# Patient Record
Sex: Male | Born: 1970 | Race: White | Hispanic: No | Marital: Married | Smoking: Never smoker
Health system: Southern US, Community
[De-identification: ages and names within clinical notes are randomized; demographics above are authoritative.]

## PROBLEM LIST (undated history)

## (undated) DIAGNOSIS — J45909 Unspecified asthma, uncomplicated: Secondary | ICD-10-CM

## (undated) DIAGNOSIS — N200 Calculus of kidney: Secondary | ICD-10-CM

## (undated) DIAGNOSIS — K219 Gastro-esophageal reflux disease without esophagitis: Secondary | ICD-10-CM

## (undated) DIAGNOSIS — I10 Essential (primary) hypertension: Secondary | ICD-10-CM

## (undated) DIAGNOSIS — D126 Benign neoplasm of colon, unspecified: Secondary | ICD-10-CM

## (undated) DIAGNOSIS — R7303 Prediabetes: Secondary | ICD-10-CM

## (undated) DIAGNOSIS — E669 Obesity, unspecified: Secondary | ICD-10-CM

## (undated) DIAGNOSIS — N189 Chronic kidney disease, unspecified: Secondary | ICD-10-CM

## (undated) DIAGNOSIS — G4730 Sleep apnea, unspecified: Secondary | ICD-10-CM

## (undated) DIAGNOSIS — J302 Other seasonal allergic rhinitis: Secondary | ICD-10-CM

## (undated) HISTORY — PX: ACHILLES TENDON REPAIR: SUR1153

## (undated) HISTORY — PX: TONSILLECTOMY: SUR1361

## (undated) HISTORY — PX: KIDNEY STONE SURGERY: SHX686

## (undated) HISTORY — PX: ESOPHAGOGASTRODUODENOSCOPY: SHX1529

## (undated) HISTORY — PX: OTHER SURGICAL HISTORY: SHX169

## (undated) HISTORY — PX: HERNIA REPAIR: SHX51

---

## 2004-07-16 ENCOUNTER — Emergency Department: Payer: Self-pay | Admitting: Emergency Medicine

## 2006-03-16 ENCOUNTER — Observation Stay: Payer: Self-pay | Admitting: Urology

## 2011-07-14 ENCOUNTER — Ambulatory Visit: Payer: Self-pay | Admitting: Family Medicine

## 2013-06-14 ENCOUNTER — Ambulatory Visit: Payer: Self-pay | Admitting: Gastroenterology

## 2013-08-26 ENCOUNTER — Ambulatory Visit: Payer: Self-pay | Admitting: Gastroenterology

## 2013-08-28 LAB — PATHOLOGY REPORT

## 2014-02-19 DIAGNOSIS — J45909 Unspecified asthma, uncomplicated: Secondary | ICD-10-CM | POA: Insufficient documentation

## 2014-05-11 IMAGING — US ABDOMEN ULTRASOUND LIMITED
1 series · 14 of 25 positions shown · non-contrast
Comparison: None.

CLINICAL DATA: Epigastric pain

EXAM:
US ABDOMEN LIMITED - RIGHT UPPER QUADRANT

[Series 1: abdomen ultrasound limited · 0.33mm/px · 14 of 48 slices shown]
[im 1/48]
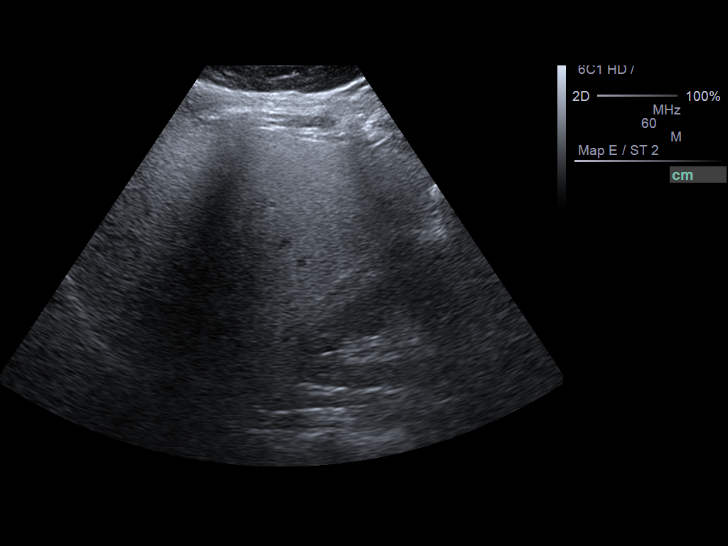
[im 4/48]
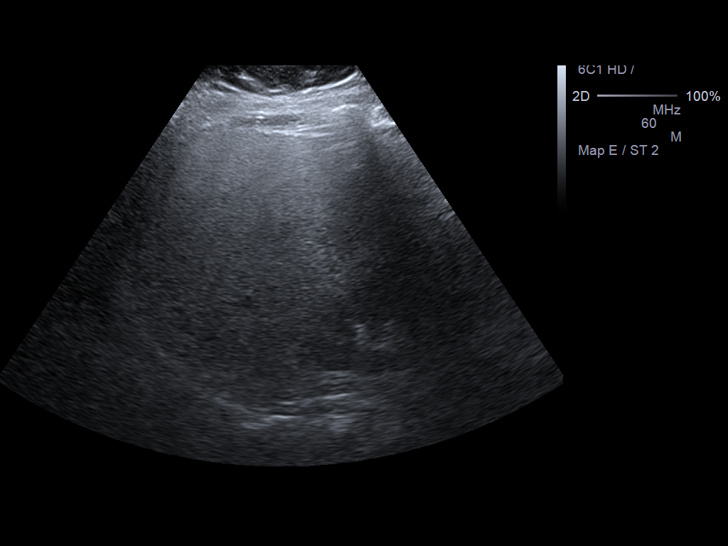
[im 8/48]
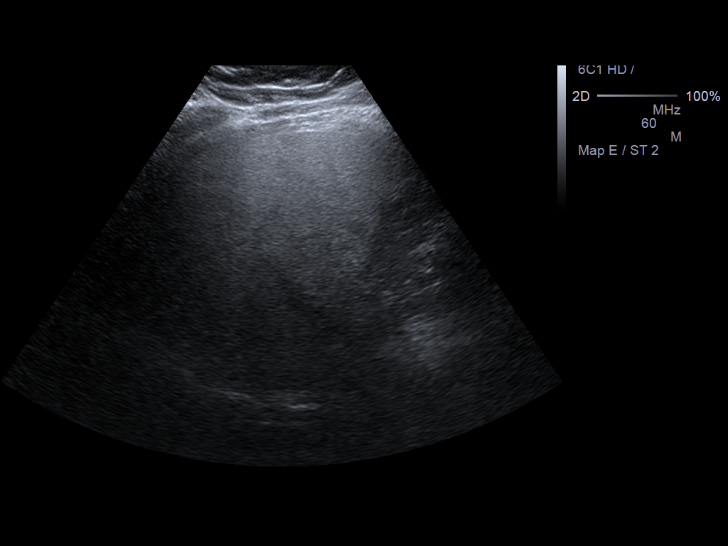
[im 12/48]
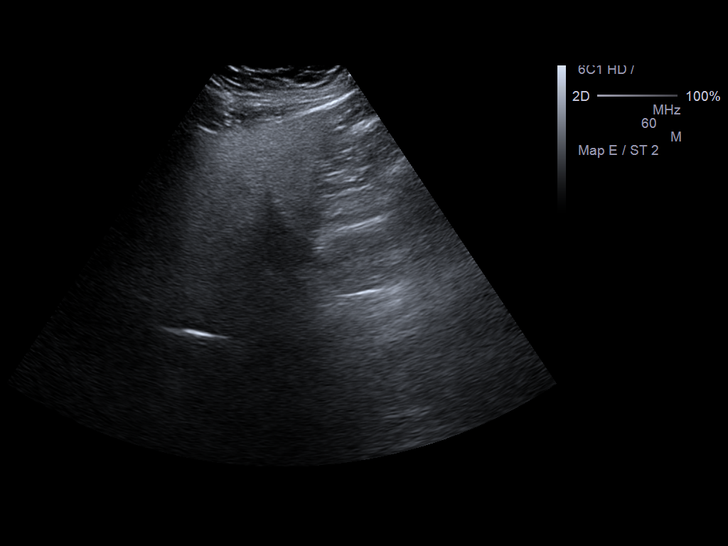
[im 16/48]
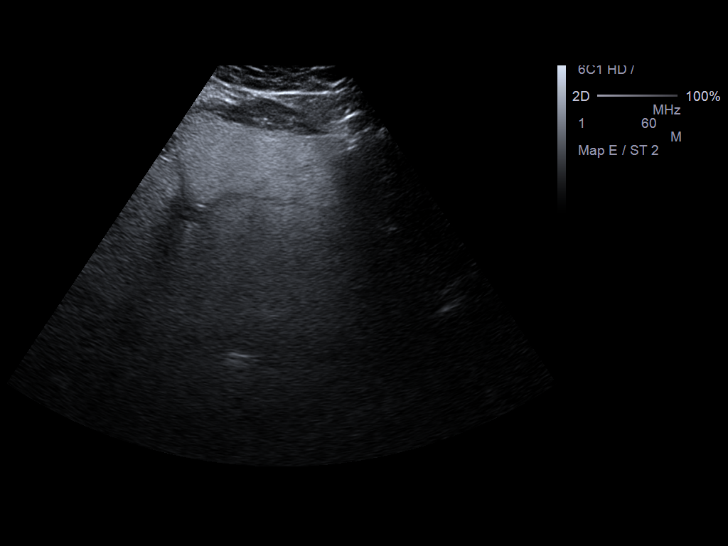
[im 18/48]
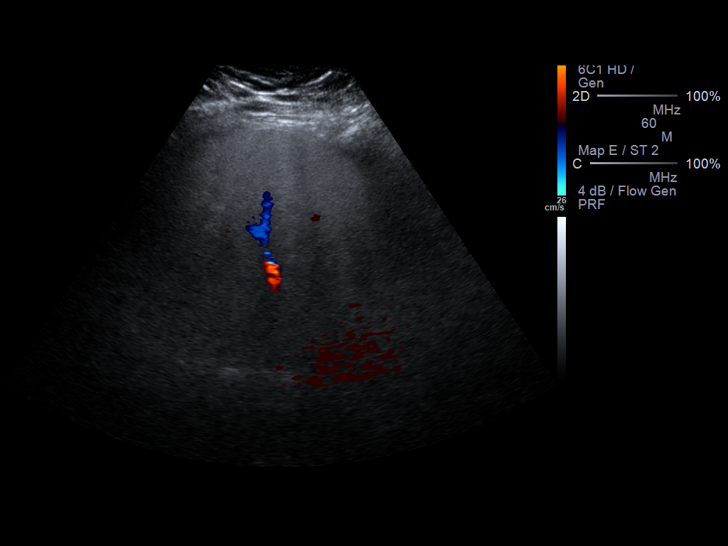
[im 22/48]
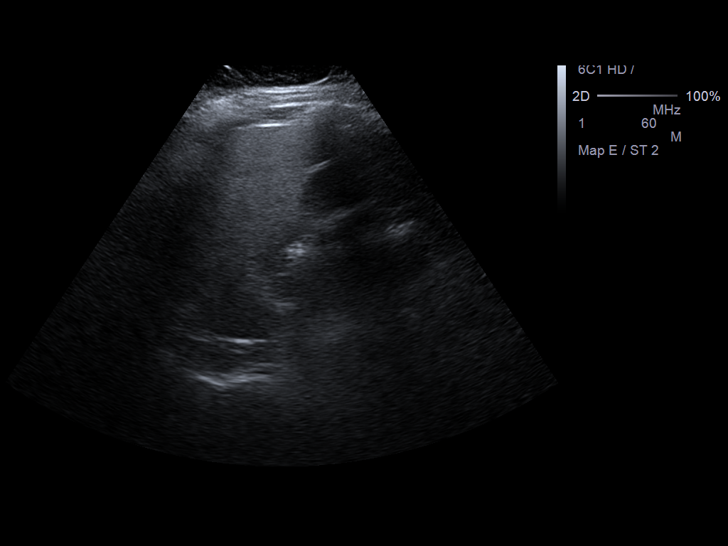
[im 26/48]
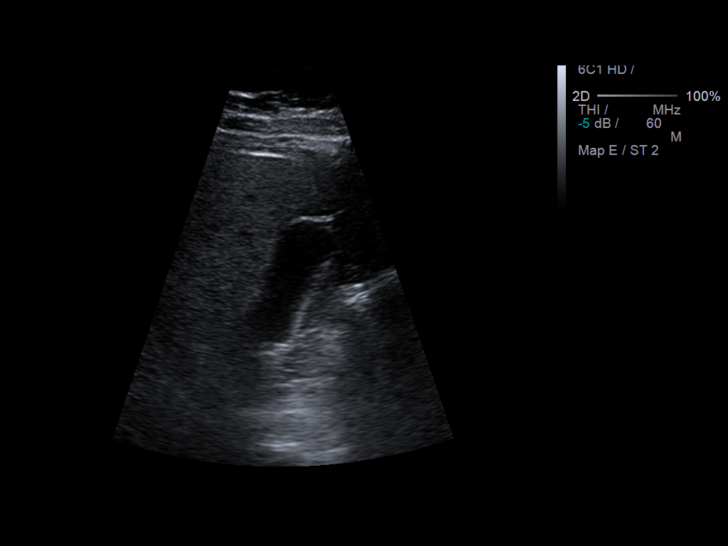
[im 30/48]
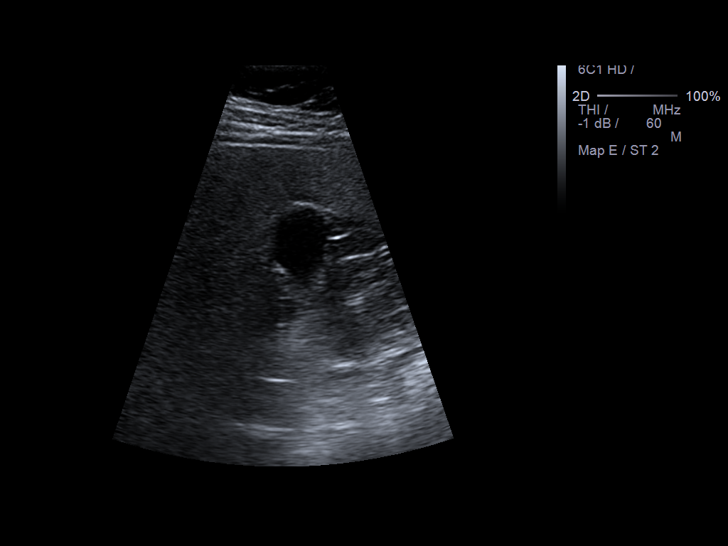
[im 32/48]
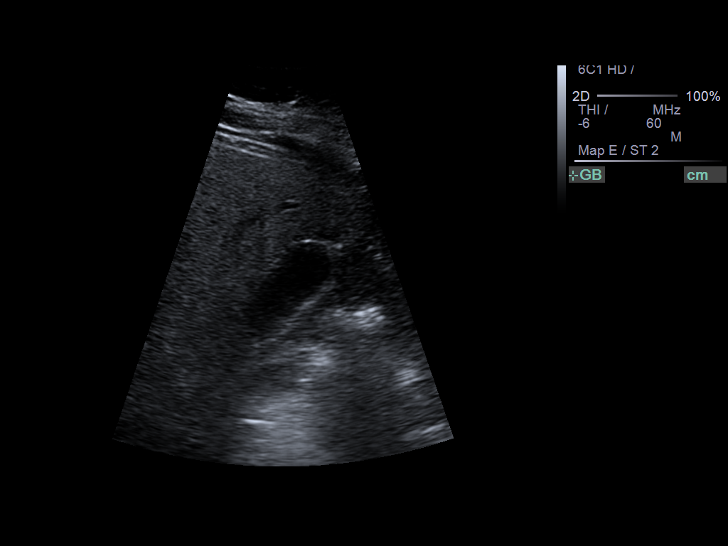
[im 36/48]
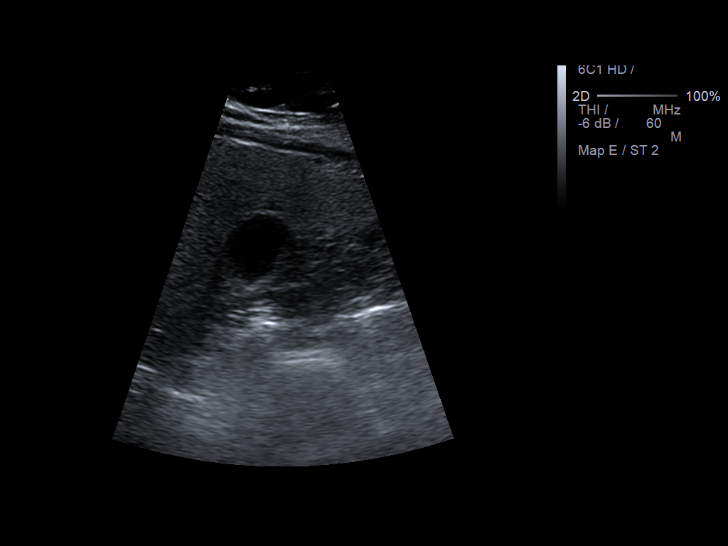
[im 40/48]
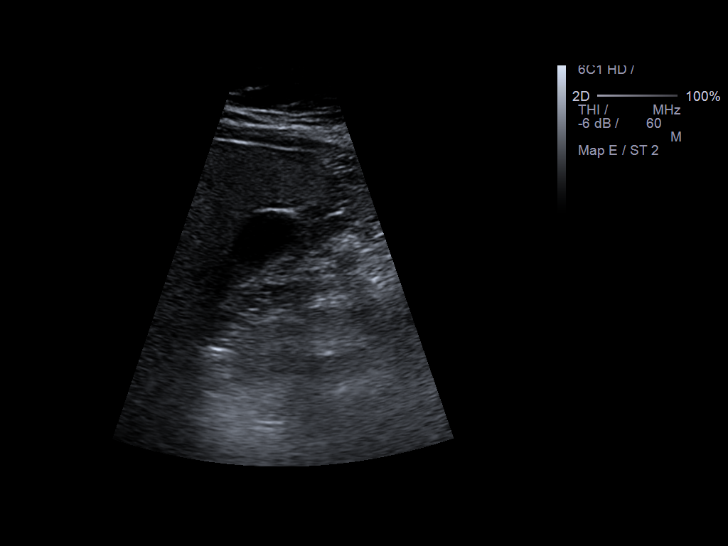
[im 44/48]
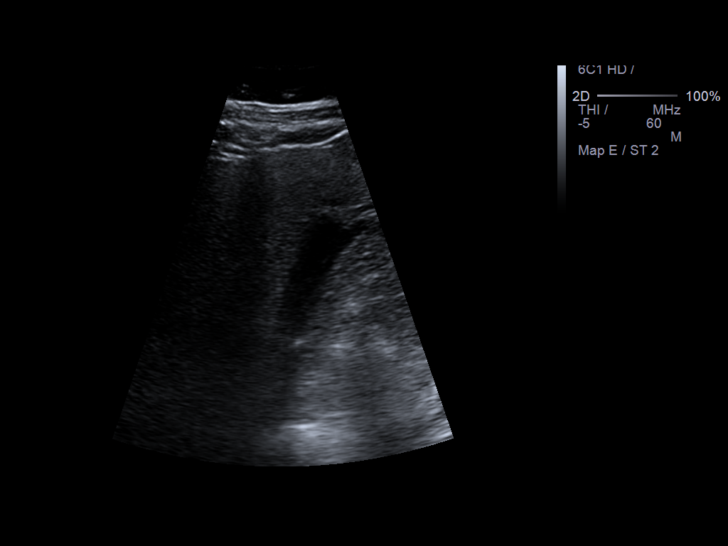
[im 48/48]
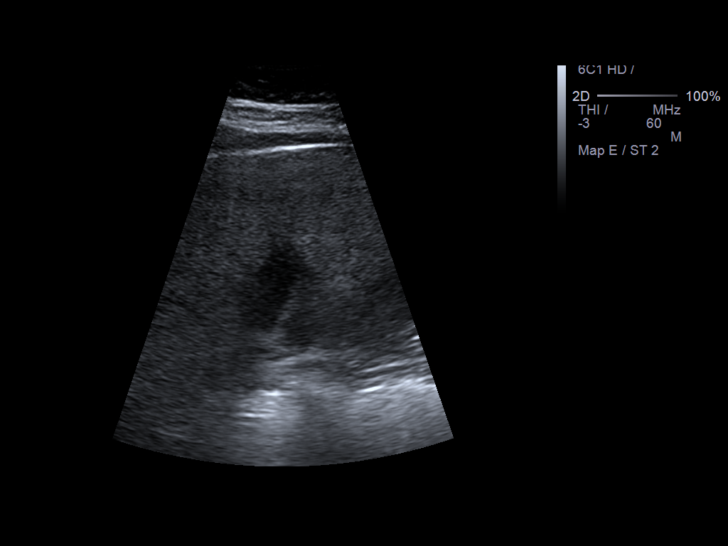

[14 of 25 positions shown; findings below may reference images not displayed]

FINDINGS: Gallbladder

No gallstones or wall thickening visualized. No sonographic Murphy
sign noted.

Common bile duct

Diameter: 3.8 mm

Liver:

Echogenic liver diffusely consistent with hepatocellular disease/
fatty infiltration. No focal liver lesion or ascites.
IMPRESSION: Negative for gallstone

Fatty liver.

## 2014-12-24 ENCOUNTER — Other Ambulatory Visit: Payer: Self-pay | Admitting: Internal Medicine

## 2014-12-24 DIAGNOSIS — E7800 Pure hypercholesterolemia, unspecified: Secondary | ICD-10-CM | POA: Insufficient documentation

## 2014-12-24 DIAGNOSIS — R739 Hyperglycemia, unspecified: Secondary | ICD-10-CM | POA: Insufficient documentation

## 2014-12-24 DIAGNOSIS — N289 Disorder of kidney and ureter, unspecified: Secondary | ICD-10-CM

## 2014-12-30 ENCOUNTER — Ambulatory Visit
Admission: RE | Admit: 2014-12-30 | Discharge: 2014-12-30 | Disposition: A | Discharge status: Home / Self Care | Payer: BLUE CROSS/BLUE SHIELD | Source: Ambulatory Visit | Attending: Internal Medicine | Admitting: Internal Medicine

## 2014-12-30 DIAGNOSIS — N289 Disorder of kidney and ureter, unspecified: Secondary | ICD-10-CM | POA: Diagnosis present

## 2015-08-09 IMAGING — US US RENAL
1 series · 14 of 25 positions shown · non-contrast
Comparison: Limited right upper quadrant ultrasound 06/14/2013

CLINICAL DATA: Acute renal insufficiency.

EXAM:
RENAL / URINARY TRACT ULTRASOUND COMPLETE

[Series 1: us renal · 0.24mm/px · 14 of 32 slices shown]
[im 1/32]
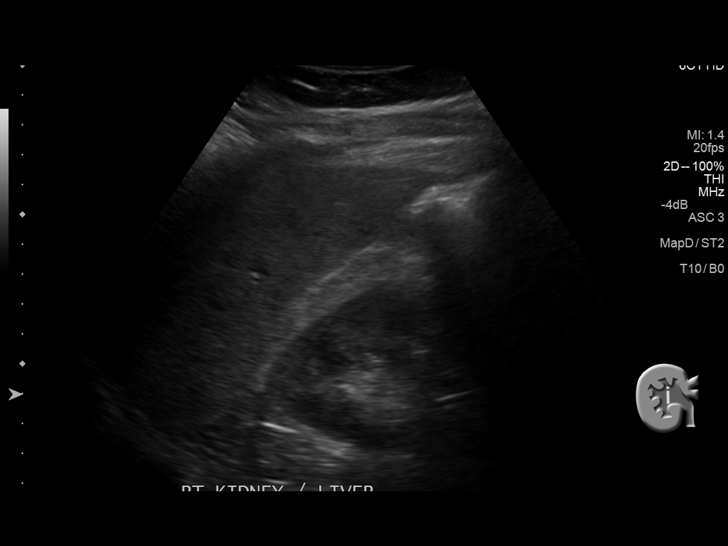
[im 3/32]
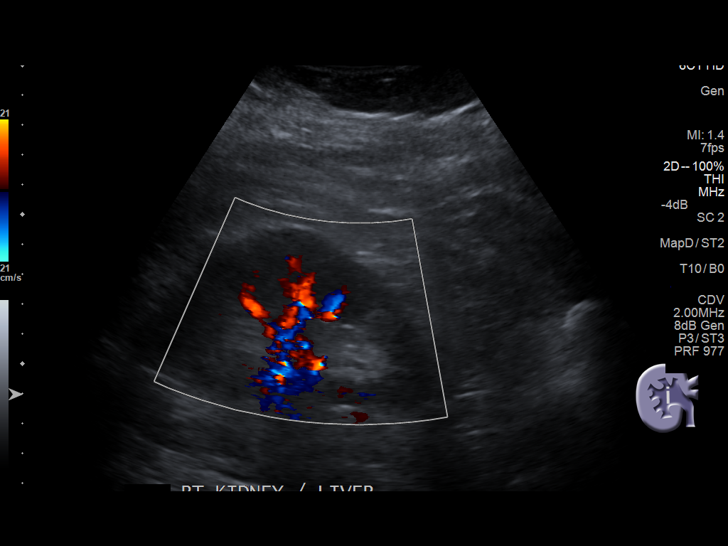
[im 6/32]
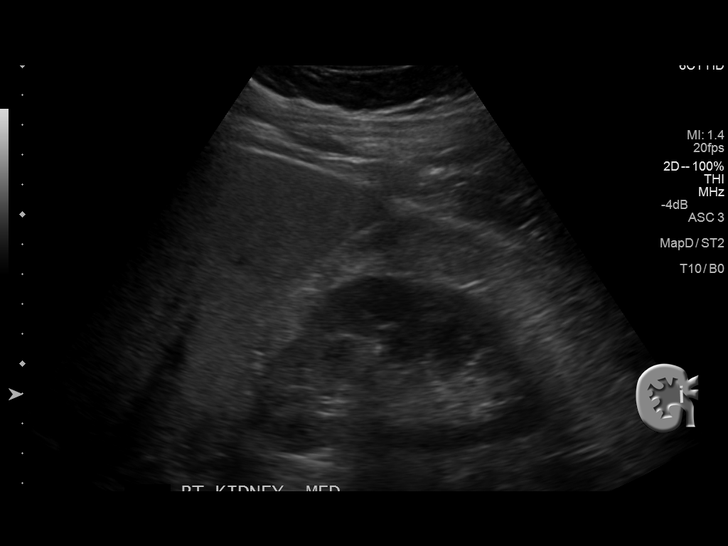
[im 8/32]
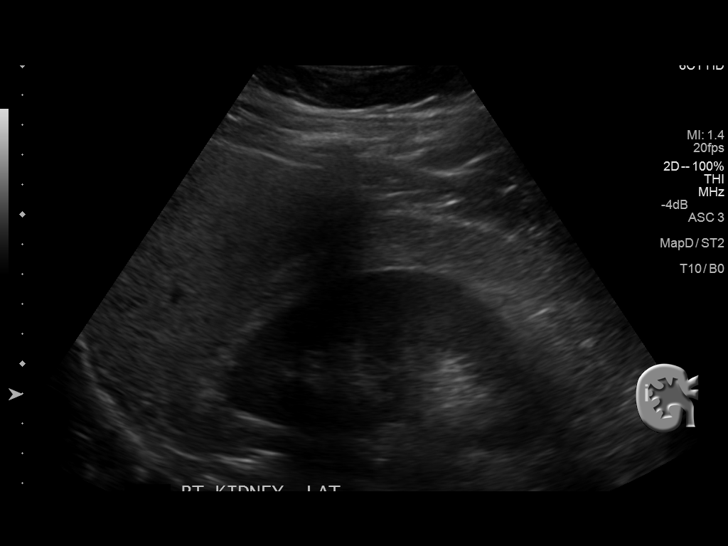
[im 11/32]
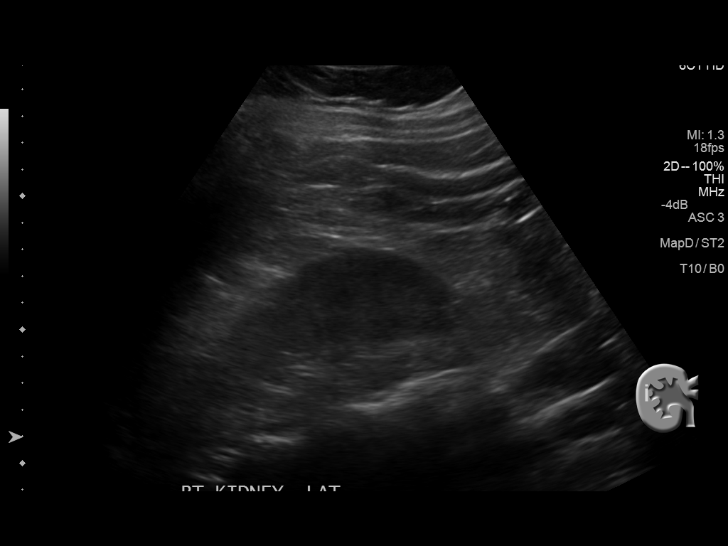
[im 12/32]
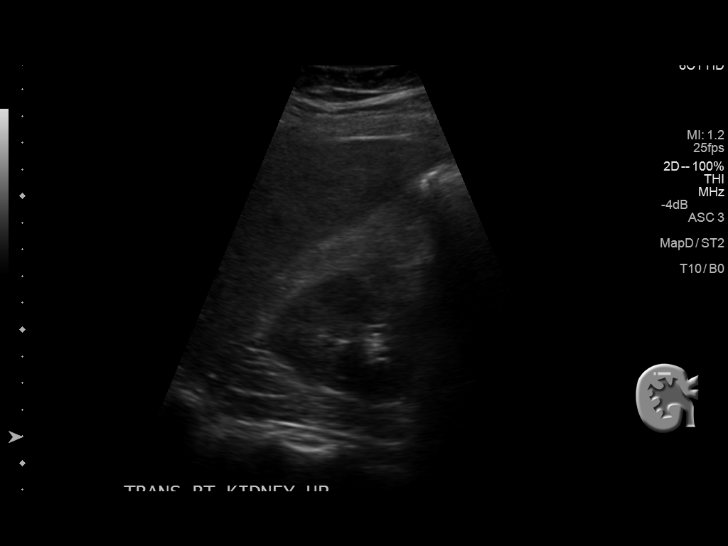
[im 15/32]
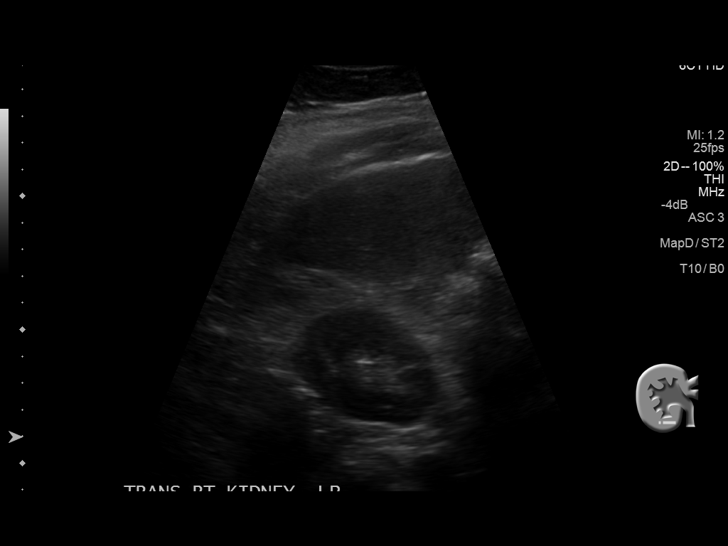
[im 17/32]
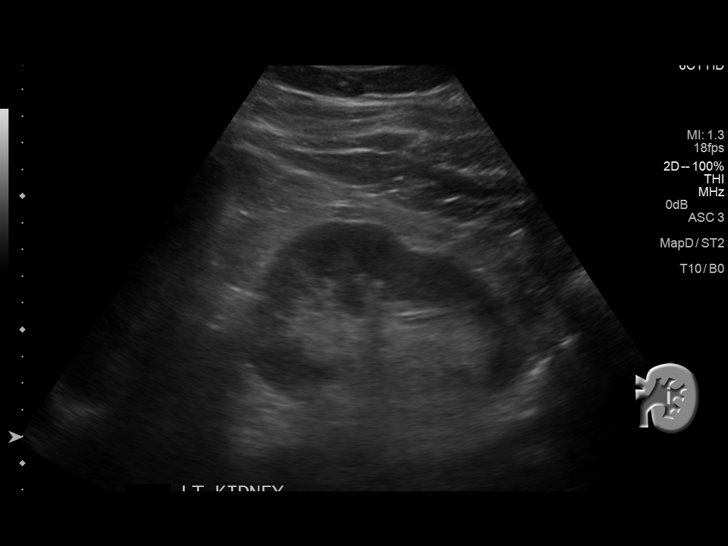
[im 20/32]
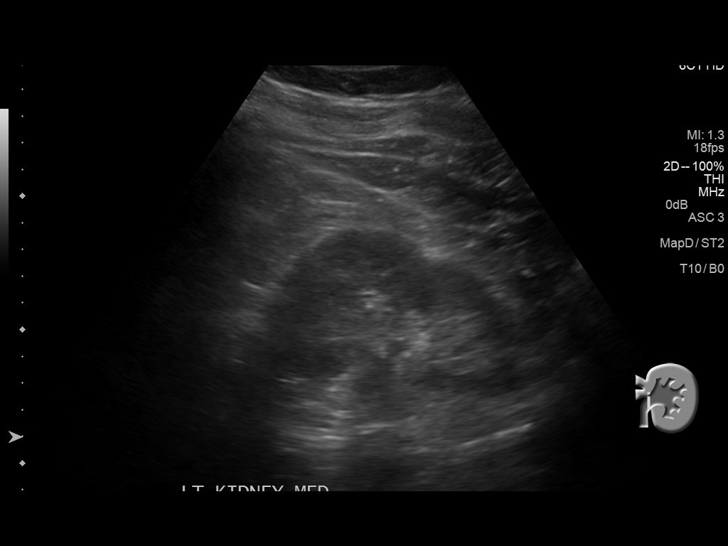
[im 21/32]
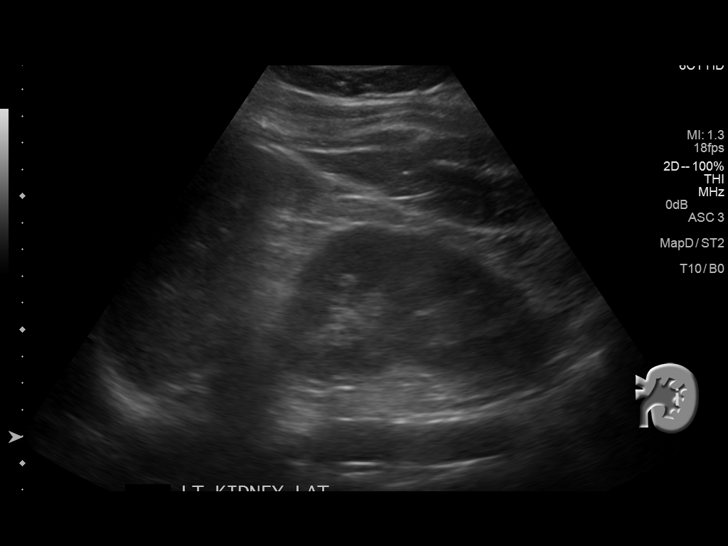
[im 24/32]
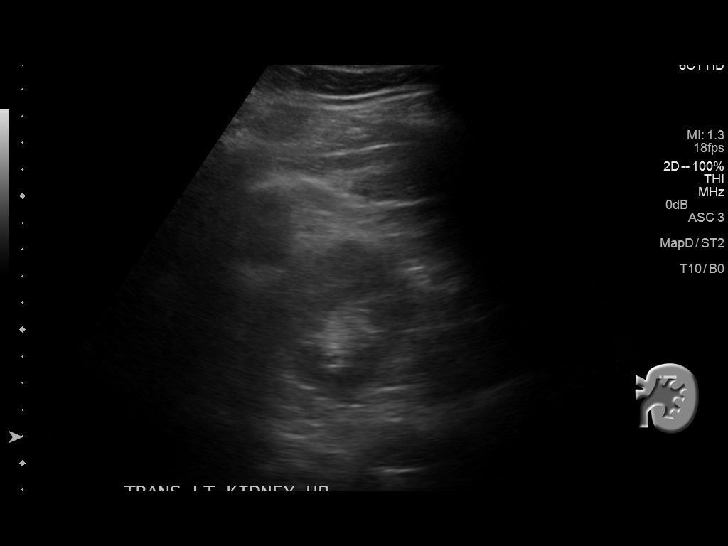
[im 26/32]
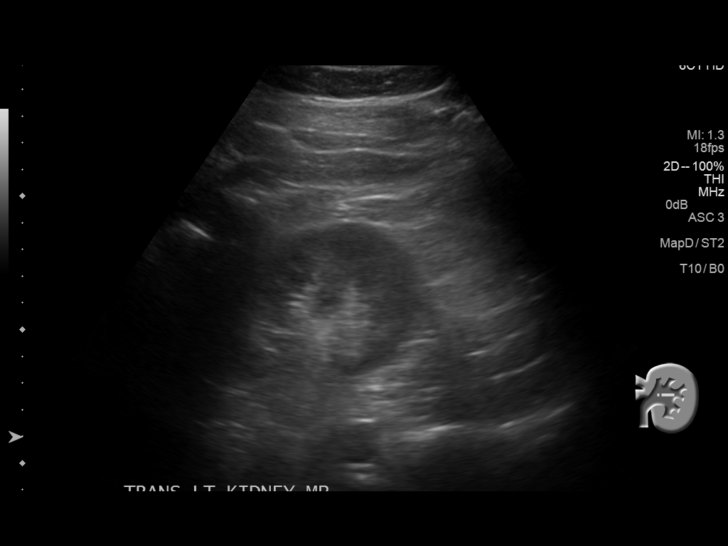
[im 29/32]
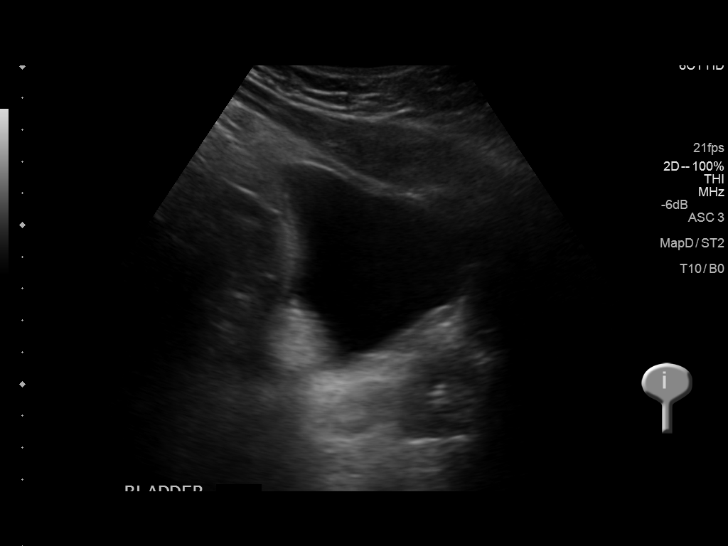
[im 32/32]
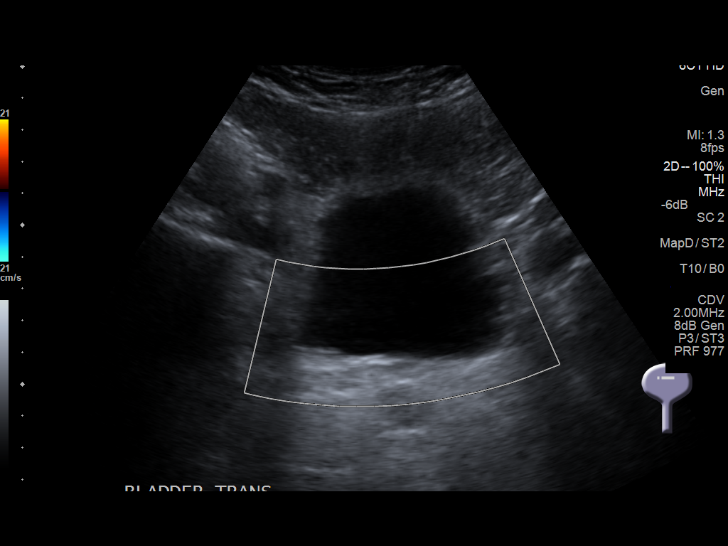

[14 of 25 positions shown; findings below may reference images not displayed]

FINDINGS: Right Kidney:

Length: 11.4 cm, within normal limits. Echogenicity within normal
limits. No mass or hydronephrosis visualized.

Left Kidney:

Length: 11.0 cm, within normal limits. Echogenicity within normal
limits. No mass or hydronephrosis visualized.

Bladder:

Appears normal for degree of bladder distention.
IMPRESSION: Negative renal ultrasound.

## 2015-12-29 ENCOUNTER — Encounter: Payer: Self-pay | Admitting: *Deleted

## 2015-12-29 ENCOUNTER — Inpatient Hospital Stay: Admission: RE | Admit: 2015-12-29 | Payer: BLUE CROSS/BLUE SHIELD | Source: Ambulatory Visit

## 2015-12-29 NOTE — Patient Instructions (Addendum)
  Your procedure is scheduled on: 01-04-16 (MONDAY) Report to [place] To find out your arrival time please call [phone] between 1PM - 3PM on 01-01-16 (FRIDAY)  Remember: Instructions that are not followed completely may result in serious medical risk, up to and including death, or upon the discretion of your surgeon and anesthesiologist your surgery may need to be rescheduled.    _X___ 1. Do not eat food or drink liquids after midnight. No gum chewing or hard candies.     _X___ 2. No Alcohol for 24 hours before or after surgery.   ____ 3. Bring all medications with you on the day of surgery if instructed.    _X___ 4. Notify your doctor if there is any change in your medical condition     (cold, fever, infections).     Do not wear jewelry, make-up, hairpins, clips or nail polish.  Do not wear lotions, powders, or perfumes. You may wear deodorant.  Do not shave 48 hours prior to surgery. Men may shave face and neck.  Do not bring valuables to the hospital.    [facility] is not responsible for any belongings or valuables.               Contacts, dentures or bridgework may not be worn into surgery.  Leave your suitcase in the car. After surgery it may be brought to your room.  For patients admitted to the hospital, discharge time is determined by your treatment team.   Patients discharged the day of surgery will not be allowed to drive home.   Please read over the following fact sheets that you were given:    _X___ Take these medicines the morning of surgery with A SIP OF WATER:    1. SINGULAIR  2. [place]  3. TAKE A NEXIUM ON Sunday NIGHT BEFORE BED  4.  5.  6.  ____ Fleet Enema (as directed)   ____ Use CHG Soap as directed  _X___ Use inhalers on the day of surgery-USE ALBUTEROL INHALER AT HOME AM [place]  ____ Stop metformin 2 days prior to surgery    ____ Take 1/2 of usual insulin dose the night before  surgery and none on the morning of surgery.   ____ Stop Coumadin/Plavix/aspirin-N/A  _X___ Stop Anti-inflammatories-NO NSAIDS OR ASA PRODUCTS-TYLENOL OK TO TAKE   ____ Stop supplements until after surgery.    ____ Bring C-Pap to the hospital.

## 2016-01-01 MED ORDER — PHENYLEPHRINE HCL 10 % OP SOLN
Freq: Once | OPHTHALMIC | Status: DC
  Filled 2016-01-01: qty 20

## 2016-01-04 ENCOUNTER — Ambulatory Visit
Admission: RE | Admit: 2016-01-04 | Discharge: 2016-01-04 | Disposition: A | Discharge status: Home / Self Care | Payer: BLUE CROSS/BLUE SHIELD | Source: Ambulatory Visit | Attending: Otolaryngology | Admitting: Otolaryngology

## 2016-01-04 ENCOUNTER — Ambulatory Visit: Payer: BLUE CROSS/BLUE SHIELD | Admitting: Anesthesiology

## 2016-01-04 ENCOUNTER — Encounter
Admission: RE | Disposition: A | Discharge status: Home / Self Care | Payer: Self-pay | Source: Ambulatory Visit | Attending: Otolaryngology

## 2016-01-04 ENCOUNTER — Encounter: Payer: Self-pay | Admitting: Anesthesiology

## 2016-01-04 DIAGNOSIS — G4733 Obstructive sleep apnea (adult) (pediatric): Secondary | ICD-10-CM | POA: Diagnosis not present

## 2016-01-04 DIAGNOSIS — J45909 Unspecified asthma, uncomplicated: Secondary | ICD-10-CM | POA: Diagnosis not present

## 2016-01-04 DIAGNOSIS — E669 Obesity, unspecified: Secondary | ICD-10-CM | POA: Diagnosis not present

## 2016-01-04 DIAGNOSIS — J3501 Chronic tonsillitis: Secondary | ICD-10-CM | POA: Diagnosis present

## 2016-01-04 DIAGNOSIS — J343 Hypertrophy of nasal turbinates: Secondary | ICD-10-CM | POA: Diagnosis present

## 2016-01-04 DIAGNOSIS — K219 Gastro-esophageal reflux disease without esophagitis: Secondary | ICD-10-CM | POA: Diagnosis not present

## 2016-01-04 DIAGNOSIS — Z6832 Body mass index (BMI) 32.0-32.9, adult: Secondary | ICD-10-CM | POA: Insufficient documentation

## 2016-01-04 DIAGNOSIS — J342 Deviated nasal septum: Secondary | ICD-10-CM | POA: Insufficient documentation

## 2016-01-04 HISTORY — DX: Sleep apnea, unspecified: G47.30

## 2016-01-04 HISTORY — PX: NASAL SEPTOPLASTY W/ TURBINOPLASTY: SHX2070

## 2016-01-04 HISTORY — PX: TONSILLECTOMY AND ADENOIDECTOMY: SHX28

## 2016-01-04 HISTORY — DX: Unspecified asthma, uncomplicated: J45.909

## 2016-01-04 HISTORY — DX: Chronic kidney disease, unspecified: N18.9

## 2016-01-04 HISTORY — DX: Gastro-esophageal reflux disease without esophagitis: K21.9

## 2016-01-04 HISTORY — DX: Other seasonal allergic rhinitis: J30.2

## 2016-01-04 SURGERY — SEPTOPLASTY, NOSE, WITH NASAL TURBINATE REDUCTION
Anesthesia: General

## 2016-01-04 MED ORDER — CEFAZOLIN SODIUM-DEXTROSE 2-4 GM/100ML-% IV SOLN
INTRAVENOUS | Status: AC
  Administered 2016-01-04: 2000 mg via INTRAVENOUS
  Filled 2016-01-04: qty 100

## 2016-01-04 MED ORDER — ONDANSETRON HCL 4 MG/2ML IJ SOLN
4.0000 mg | Freq: Once | INTRAMUSCULAR | Status: AC | PRN
  Administered 2016-01-04: 4 mg via INTRAVENOUS

## 2016-01-04 MED ORDER — FENTANYL CITRATE (PF) 100 MCG/2ML IJ SOLN
INTRAMUSCULAR | Status: AC
  Filled 2016-01-04: qty 2

## 2016-01-04 MED ORDER — ONDANSETRON HCL 4 MG/2ML IJ SOLN
INTRAMUSCULAR | Status: AC
  Filled 2016-01-04: qty 2

## 2016-01-04 MED ORDER — MIDAZOLAM HCL 2 MG/2ML IJ SOLN
INTRAMUSCULAR | Status: DC | PRN
  Administered 2016-01-04: 2 mg via INTRAVENOUS

## 2016-01-04 MED ORDER — BACITRACIN ZINC 500 UNIT/GM EX OINT
TOPICAL_OINTMENT | CUTANEOUS | Status: AC
  Filled 2016-01-04: qty 28.35

## 2016-01-04 MED ORDER — LIDOCAINE-EPINEPHRINE (PF) 1 %-1:200000 IJ SOLN
INTRAMUSCULAR | Status: DC | PRN
  Administered 2016-01-04: 5 mL

## 2016-01-04 MED ORDER — GLYCOPYRROLATE 0.2 MG/ML IJ SOLN
INTRAMUSCULAR | Status: AC
  Administered 2016-01-04: .2 mg via INTRAVENOUS
  Filled 2016-01-04: qty 1

## 2016-01-04 MED ORDER — FENTANYL CITRATE (PF) 100 MCG/2ML IJ SOLN
INTRAMUSCULAR | Status: DC | PRN
  Administered 2016-01-04: 50 ug via INTRAVENOUS
  Administered 2016-01-04: 100 ug via INTRAVENOUS
  Administered 2016-01-04 (×2): 50 ug via INTRAVENOUS

## 2016-01-04 MED ORDER — LIDOCAINE-EPINEPHRINE (PF) 1 %-1:200000 IJ SOLN
INTRAMUSCULAR | Status: AC
  Filled 2016-01-04: qty 30

## 2016-01-04 MED ORDER — OXYMETAZOLINE HCL 0.05 % NA SOLN
NASAL | Status: AC
  Administered 2016-01-04: 2 via NASAL
  Filled 2016-01-04: qty 15

## 2016-01-04 MED ORDER — SUGAMMADEX SODIUM 200 MG/2ML IV SOLN
INTRAVENOUS | Status: DC | PRN
  Administered 2016-01-04: 200 mg via INTRAVENOUS

## 2016-01-04 MED ORDER — CEFAZOLIN SODIUM-DEXTROSE 2-4 GM/100ML-% IV SOLN
2.0000 g | Freq: Once | INTRAVENOUS | Status: AC
  Administered 2016-01-04: 2000 mg via INTRAVENOUS

## 2016-01-04 MED ORDER — ROCURONIUM BROMIDE 100 MG/10ML IV SOLN
INTRAVENOUS | Status: DC | PRN
  Administered 2016-01-04 (×3): 5 mg via INTRAVENOUS
  Administered 2016-01-04: 30 mg via INTRAVENOUS

## 2016-01-04 MED ORDER — FENTANYL CITRATE (PF) 100 MCG/2ML IJ SOLN
25.0000 ug | INTRAMUSCULAR | Status: DC | PRN
  Administered 2016-01-04 (×2): 25 ug via INTRAVENOUS

## 2016-01-04 MED ORDER — OXYMETAZOLINE HCL 0.05 % NA SOLN
2.0000 | Freq: Once | NASAL | Status: AC
  Administered 2016-01-04: 2 via NASAL

## 2016-01-04 MED ORDER — LIDOCAINE HCL (CARDIAC) 20 MG/ML IV SOLN
INTRAVENOUS | Status: DC | PRN
  Administered 2016-01-04: 40 mg via INTRAVENOUS

## 2016-01-04 MED ORDER — OXYCODONE HCL 5 MG/5ML PO SOLN
ORAL | Status: AC
  Administered 2016-01-04: 5 mg
  Filled 2016-01-04: qty 5

## 2016-01-04 MED ORDER — DEXAMETHASONE SODIUM PHOSPHATE 10 MG/ML IJ SOLN
INTRAMUSCULAR | Status: DC | PRN
  Administered 2016-01-04: 10 mg via INTRAVENOUS

## 2016-01-04 MED ORDER — PROPOFOL 10 MG/ML IV BOLUS
INTRAVENOUS | Status: DC | PRN
  Administered 2016-01-04: 170 mg via INTRAVENOUS

## 2016-01-04 MED ORDER — LACTATED RINGERS IV SOLN
INTRAVENOUS | Status: DC
  Administered 2016-01-04 (×2): via INTRAVENOUS

## 2016-01-04 MED ORDER — PHENYLEPHRINE HCL 10 % OP SOLN
OPHTHALMIC | Status: DC | PRN
  Administered 2016-01-04: 08:00:00 via TOPICAL

## 2016-01-04 MED ORDER — SUCCINYLCHOLINE CHLORIDE 20 MG/ML IJ SOLN
INTRAMUSCULAR | Status: DC | PRN
  Administered 2016-01-04: 80 mg via INTRAVENOUS

## 2016-01-04 MED ORDER — ONDANSETRON HCL 4 MG/2ML IJ SOLN
INTRAMUSCULAR | Status: DC | PRN
  Administered 2016-01-04: 4 mg via INTRAVENOUS

## 2016-01-04 SURGICAL SUPPLY — 36 items
BLADE SURG 15 STRL LF DISP TIS (BLADE) ×2 IMPLANT
BLADE SURG 15 STRL SS (BLADE) ×2
CANISTER SUCT 1200ML W/VALVE (MISCELLANEOUS) ×8 IMPLANT
CATH ROBINSON RED A/P 10FR (CATHETERS) ×4 IMPLANT
COAG SUCT 10F 3.5MM HAND CTRL (MISCELLANEOUS) ×4 IMPLANT
DECANTER SPIKE VIAL GLASS SM (MISCELLANEOUS) ×4 IMPLANT
DRSG NASAL 4CM NASOPORE (MISCELLANEOUS) ×4 IMPLANT
ELECT CAUTERY BLADE TIP 2.5 (TIP) ×4
ELECT REM PT RETURN 9FT ADLT (ELECTROSURGICAL) ×4
ELECTRODE CAUTERY BLDE TIP 2.5 (TIP) ×2 IMPLANT
ELECTRODE REM PT RTRN 9FT ADLT (ELECTROSURGICAL) ×2 IMPLANT
GLOVE EXAM LX STRL 7.5 (GLOVE) ×4 IMPLANT
GOWN STRL REUS W/ TWL LRG LVL3 (GOWN DISPOSABLE) ×4 IMPLANT
GOWN STRL REUS W/TWL LRG LVL3 (GOWN DISPOSABLE) ×4
HANDLE YANKAUER SUCT BULB TIP (MISCELLANEOUS) IMPLANT
LABEL OR SOLS (LABEL) ×4 IMPLANT
NEEDLE SPNL 25GX3.5 QUINCKE BL (NEEDLE) IMPLANT
NS IRRIG 500ML POUR BTL (IV SOLUTION) ×4 IMPLANT
PACK BASIC III (MISCELLANEOUS)
PACK HEAD/NECK (MISCELLANEOUS) ×4 IMPLANT
PACK SRG BSC III STRL LF (MISCELLANEOUS) IMPLANT
PATTIES SURGICAL .5 X3 (DISPOSABLE) ×4 IMPLANT
PENCIL ELECTRO HAND CTR (MISCELLANEOUS) IMPLANT
SPLINT NASAL REUTER (MISCELLANEOUS) ×4 IMPLANT
SPONGE TONSIL 1 RF SGL (DISPOSABLE) ×4 IMPLANT
SPONGE XRAY 4X4 16PLY STRL (MISCELLANEOUS) IMPLANT
SUT CHROMIC 3-0 (SUTURE) ×2
SUT CHROMIC 3-0 KS 27XMFL CR (SUTURE) ×2
SUT ETHILON 3-0 KS 30 BLK (SUTURE) ×4 IMPLANT
SUT PLAIN GUT 4-0 (SUTURE) ×4 IMPLANT
SUT VIC AB 3-0 SH 27 (SUTURE) ×4
SUT VIC AB 3-0 SH 27X BRD (SUTURE) ×4 IMPLANT
SUTURE CHRMC 3-0 KS 27XMFL CR (SUTURE) ×2 IMPLANT
SYR 3ML LL SCALE MARK (SYRINGE) ×4 IMPLANT
TUBING CONNECTING 10 (TUBING) IMPLANT
TUBING CONNECTING 10' (TUBING)

## 2016-01-04 NOTE — Op Note (Signed)
01/04/2016  8:48 AM   [id number]   Pre-Op Dx:  Deviated Nasal Septum, Hypertrophic Inferior Turbinates, chronic tonsillitis, obstructive sleep apnea  Post-op Dx: Same  Proc: Nasal Septoplasty, Bilateral Partial Reduction Inferior Turbinates, tonsillectomy  Surg:  Corazon Nickolas H  Anes:  GOT  EBL:  50 mL  Comp:  None  Findings: Septum markedly deviated to left side with a very large right inferior turbinate. Very cryptic tonsils with lots of debris hiding in them. Very long uvula  Procedure: With the patient in a comfortable supine position,  general orotracheal anesthesia was induced without difficulty.     The patient received preoperative Afrin spray for topical decongestion and vasoconstriction.  Intravenous prophylactic antibiotics were administered.  At an appropriate level, the patient was placed in a semi-sitting position.  Nasal vibrissae were trimmed.   1% Xylocaine with 1:100,000 epinephrine, 10 cc's, was infiltrated into the anterior floor of the nose, into the nasal spine region, into the membranous columella, and finally into the submucoperichondrial plane of the septum on both sides.  Several minutes were allowed for this to take effect.  Cottoniod pledgetts soaked in Afrin and 4% Xylocaine were placed into both nasal cavities and left while the patient was prepped and draped in the standard fashion.  The materials were removed from the nose and observed to be intact and correct in number.  The nose was inspected with a headlight with the findings as described above.  A left Killian incision was sharply executed and carried down to the caudal edge of the quadrangular cartilage. The mucoperichondrium was elelvated along the quadrangular plate back to the bony-cartilaginous junction. The mucoperiostium was then elevated along the ethmoid plate and the vomer. The boney-catilaginous junction was then split with a freer elevator and the mucoperiosteum was elevated on the opposite  side. The mucoperiosteum was then elevated along the maxillary crest as needed to expose the crooked bone of the crest.  Boney spurs of the vomer and maxillary crest were removed with [place]avan Foil forceps.  The cartilaginous plate was trimmed along its posterior and inferior borders of about 2 mm of cartilage to free it up inferiorly. Some of the deviated ethmoid plate was then fractured and removed with Takahashi forceps to free up the posterior border of the quadrangular plate and allow it to swing back to the midline. The mucosal flaps were placed back into their anatomic position to allow visualization of the airways. The septum now sat in the midline with an improved airway.  The mucosal flaps are then sutured together using a through and through whip stitch of 4-0 [place] with a mini-Keith needle. This was used to close the [place] incision as well.   The inferior turbinates were then inspected. An incision was created along the inferior aspect of the left inferior turbinate with removal of some of the inferior soft tissue and bone. Electrocautery was used to control bleeding in the area. The remaining turbinate was then outfractured to open up the airway further. There was no significant bleeding noted. The right turbinate was then trimmed and outfractured in a similar fashion. The right inferior turbinate was larger and more work had to be done on the right side.  The airways were then visualized and showed open passageways on both sides that were significantly improved compared to before surgery. There was no signifcant bleeding. Nasal splints were applied to both sides of the septum using Xomed 0.15mm regular sized splints that were trimmed, and then held in position with  a 3-0 Nylon through and through suture.  A Davis mouth gag was used to then visualize the oropharynx. The tonsils were very cryptic and had lots of food debris hiding in both tonsils. The tonsils were grasped and pulled medially  and the anterior pillar was incised with electrocautery. The tonsils were dissected from their fossa using blunt dissection and electrocautery. The peritonsillar area was quite scarred down to the underlying muscles. Bleeding was controlled with electrocautery. This was done bilaterally. The posterior tonsillar pillar was then sutured to the anterior tonsillar pillar anteriorly to help close the superior half of the tonsillar fossa. This helped to widen the posterior airway. The uvula was extremely long and was trimmed about one half its length to help stop some of the snoring. There is no further bleeding noted in the oropharynx. The naso pharynx was visualized and there was no sign of any significant adenoid tissue.  The patient was turned back over to anesthesia, and awakened, extubated, and taken to the PACU in satisfactory condition.  Dispo:   PACU to home  Plan: Ice, elevation, narcotic analgesia, steroid taper, and prophylactic antibiotics for the duration of indwelling nasal foreign bodies.  We will reevaluate the patient in the office in 6 days and remove the septal splints.  Return to work in 10 days, strenuous activities in two weeks.   Jesten Cappuccio H 01/04/2016 8:48 AM

## 2016-01-04 NOTE — Transfer of Care (Signed)
Immediate Anesthesia Transfer of Care Note  Patient: Christopher Campos  Procedure(s) Performed: Procedure(s): NASAL SEPTOPLASTY WITH SUBMUCOUS RESECTION OF INFERIOR TURBINATES (Bilateral) TONSILLECTOMY AND ADENOIDECTOMY (N/A)  Patient Location: PACU  Anesthesia Type:General  Level of Consciousness: sedated  Airway & Oxygen Therapy: Patient Spontanous Breathing and Patient connected to face mask oxygen  Post-op Assessment: Report given to RN and Post -op Vital signs reviewed and stable  Post vital signs: Reviewed and stable  Last Vitals:  Filed Vitals:   01/04/16 0639 01/04/16 0855  BP: 122/72 122/85  Pulse: 72 43  Temp: 37.1 C 36.3 C  Resp: 16 7    Last Pain: There were no vitals filed for this visit.       Complications: No apparent anesthesia complications

## 2016-01-04 NOTE — Anesthesia Procedure Notes (Signed)
Procedure Name: Intubation Date/Time: 01/04/2016 7:40 AM Performed by: Allean Found Pre-anesthesia Checklist: Patient identified, Emergency Drugs available, Suction available, Patient being monitored and Timeout performed Patient Re-evaluated:Patient Re-evaluated prior to inductionOxygen Delivery Method: Circle system utilized Preoxygenation: Pre-oxygenation with 100% oxygen Intubation Type: IV induction Ventilation: Mask ventilation without difficulty Laryngoscope Size: Mac and 3 Grade View: Grade II Tube type: Oral Rae Tube size: 7.5 mm Number of attempts: 2 Airway Equipment and Method: Stylet Placement Confirmation: ETT inserted through vocal cords under direct vision,  positive ETCO2 and breath sounds checked- equal and bilateral Secured at: 22 cm Tube secured with: Tape Dental Injury: Teeth and Oropharynx as per pre-operative assessment  Difficulty Due To: Difficulty was anticipated and Difficult Airway- due to anterior larynx Future Recommendations: Recommend- induction with short-acting agent, and alternative techniques readily available

## 2016-01-04 NOTE — Anesthesia Preprocedure Evaluation (Signed)
Anesthesia Evaluation  Patient identified by MRN, date of birth, ID band Patient awake    Reviewed: Allergy & Precautions, NPO status , Patient's Chart, lab work & pertinent test results, reviewed documented beta blocker date and time   Airway Mallampati: III  TM Distance: >3 FB     Dental  (+) Chipped   Pulmonary asthma , sleep apnea ,           Cardiovascular      Neuro/Psych    GI/Hepatic GERD  Controlled,  Endo/Other    Renal/GU Renal InsufficiencyRenal disease     Musculoskeletal   Abdominal   Peds  Hematology   Anesthesia Other Findings Does not use CPAP. Obese.   Reproductive/Obstetrics                             Anesthesia Physical Anesthesia Plan  ASA: III  Anesthesia Plan: General   Post-op Pain Management:    Induction: Intravenous  Airway Management Planned: Oral ETT  Additional Equipment:   Intra-op Plan:   Post-operative Plan:   Informed Consent: I have reviewed the patients History and Physical, chart, labs and discussed the procedure including the risks, benefits and alternatives for the proposed anesthesia with the patient or authorized representative who has indicated his/her understanding and acceptance.     Plan Discussed with: CRNA  Anesthesia Plan Comments:         Anesthesia Quick Evaluation

## 2016-01-04 NOTE — Anesthesia Postprocedure Evaluation (Signed)
Anesthesia Post Note  Patient: Christopher Campos  Procedure(s) Performed: Procedure(s) (LRB): NASAL SEPTOPLASTY WITH SUBMUCOUS RESECTION OF INFERIOR TURBINATES (Bilateral) TONSILLECTOMY  (N/A)  Patient location during evaluation: PACU Anesthesia Type: General Level of consciousness: awake and alert Pain management: pain level controlled Vital Signs Assessment: post-procedure vital signs reviewed and stable Respiratory status: spontaneous breathing, nonlabored ventilation, respiratory function stable and patient connected to nasal cannula oxygen Cardiovascular status: blood pressure returned to baseline and stable Postop Assessment: no signs of nausea or vomiting Anesthetic complications: no    Last Vitals:  Filed Vitals:   01/04/16 1020 01/04/16 1210  BP: 142/100 130/84  Pulse: 70 67  Temp: 36.4 C 36.7 C  Resp: 14 16    Last Pain:  Filed Vitals:   01/04/16 1237  PainSc: [place]

## 2016-01-04 NOTE — Discharge Instructions (Signed)

## 2016-01-04 NOTE — H&P (Signed)
  H&P has been reviewed and no changes necessary. To be downloaded later. 

## 2016-01-05 LAB — SURGICAL PATHOLOGY

## 2016-03-21 ENCOUNTER — Ambulatory Visit (INDEPENDENT_AMBULATORY_CARE_PROVIDER_SITE_OTHER): Payer: BLUE CROSS/BLUE SHIELD | Admitting: Urology

## 2016-03-21 ENCOUNTER — Encounter: Payer: Self-pay | Admitting: Urology

## 2016-03-21 VITALS — BP 119/77 | HR 75 | Ht 68.0 in | Wt 214.0 lb

## 2016-03-21 DIAGNOSIS — R3912 Poor urinary stream: Secondary | ICD-10-CM

## 2016-03-21 LAB — URINALYSIS, COMPLETE
Bilirubin, UA: NEGATIVE
GLUCOSE, UA: NEGATIVE
KETONES UA: NEGATIVE
LEUKOCYTES UA: NEGATIVE
Nitrite, UA: NEGATIVE
PROTEIN UA: NEGATIVE
RBC, UA: NEGATIVE
SPEC GRAV UA: 1.015 (ref 1.005–1.030)
Urobilinogen, Ur: 0.2 mg/dL (ref 0.2–1.0)
pH, UA: 8.5 — ABNORMAL HIGH (ref 5.0–7.5)

## 2016-03-21 LAB — MICROSCOPIC EXAMINATION: BACTERIA UA: NONE SEEN

## 2016-03-21 LAB — BLADDER SCAN AMB NON-IMAGING: Scan Result: 21

## 2016-03-21 NOTE — Progress Notes (Signed)
H&P  Chief Complaint: urinary hesitancy  History of Present Illness: Patient complains of urinary frequency, nocturia, intermittent stream, hesitancy, straining to void and weak stream for the past few months which is progressively worsening. He has a history of urethral stricture first dilated at age 45 and then again at age 60. He presented with urinary retention that these episodes. He recalls both of these events as being rather horrific and hopes to avoid being dilated awakened again. He said no dysuria or gross hematuria. He believes he saw Dr. Bernardo Heater for the second one.  His post void residual today is 21 mL. U/A clear.   June 2017: PSA 0.73, cr 1.0, U/A clear   Past Medical History:  Diagnosis Date  . Asthma    WELL CONTROLLED  . Chronic kidney disease    H/O STONES  . GERD (gastroesophageal reflux disease)   . Seasonal allergies   . Sleep apnea    NO CPAP   Past Surgical History:  Procedure Laterality Date  . ACHILLES TENDON REPAIR Right   . ESOPHAGOGASTRODUODENOSCOPY    . KIDNEY STONE SURGERY    . NASAL SEPTOPLASTY W/ TURBINOPLASTY Bilateral 01/04/2016   Procedure: NASAL SEPTOPLASTY WITH SUBMUCOUS RESECTION OF INFERIOR TURBINATES;  Surgeon: Margaretha Sheffield, MD;  Location: [facility] ORS;  Service: ENT;  Laterality: Bilateral;  . REMOVAL OF SCAR TISSUE    . TONSILLECTOMY AND ADENOIDECTOMY N/A 01/04/2016   Procedure: TONSILLECTOMY ;  Surgeon: Margaretha Sheffield, MD;  Location: [facility] ORS;  Service: ENT;  Laterality: N/A;    Home Medications:   (Not in a hospital admission) Allergies:  Allergies  Allergen Reactions  . Codeine Nausea And Vomiting  . Codeine Sulfate Nausea Only    No family history on file. Social History:  reports that he has never smoked. He does not have any smokeless tobacco history on file. He reports that he drinks alcohol. He reports that he does not use drugs.  ROS: A complete review of systems was performed.  All systems are negative except for pertinent  findings as noted. Review of Systems  HENT: Positive for congestion.   Respiratory: Positive for cough and shortness of breath.   Gastrointestinal: Positive for heartburn.     Physical Exam:  Vital signs in last 24 hours: @VSRANGES @ General:  Alert and oriented, No acute distress HEENT: Normocephalic, atraumatic Cardiovascular: Regular rate and rhythm Lungs: Regular rate and effort Extremities: No edema Neurologic: Grossly intact GU: Penis circumcised and without mass or lesion. Testicles descended bilaterally and palpably normal. DRE: Prostate palpably normal without hard area, nodule or enlargement.  Laboratory Data:  No results found for this or any previous visit (from the past 24 hour(s)). No results found for this or any previous visit (from the past 240 hour(s)). Creatinine: No results for input(s): CREATININE in the last 168 hours.  Impression/Assessment/plan:  Weak stream, hesitancy-likely urethral stricture. We'll set him up for cystoscopy. Post void normal. We discussed the nature risk and benefits of surveillance, urethral stricture dilation as well as referral to reconstructive urologist. We discussed the trauma and likely recurrence caused by dilations. He'll consider.  Mickala Laton 03/21/2016, 2:42 PM

## 2016-04-18 ENCOUNTER — Ambulatory Visit (INDEPENDENT_AMBULATORY_CARE_PROVIDER_SITE_OTHER): Payer: BLUE CROSS/BLUE SHIELD | Admitting: Urology

## 2016-04-18 DIAGNOSIS — N35919 Unspecified urethral stricture, male, unspecified site: Secondary | ICD-10-CM | POA: Insufficient documentation

## 2016-04-18 DIAGNOSIS — Z125 Encounter for screening for malignant neoplasm of prostate: Secondary | ICD-10-CM

## 2016-04-18 DIAGNOSIS — N99114 Postprocedural urethral stricture, male, unspecified: Secondary | ICD-10-CM

## 2016-04-18 DIAGNOSIS — Z8709 Personal history of other diseases of the respiratory system: Secondary | ICD-10-CM | POA: Insufficient documentation

## 2016-04-18 DIAGNOSIS — N99113 Postprocedural anterior urethral stricture: Secondary | ICD-10-CM | POA: Diagnosis not present

## 2016-04-18 DIAGNOSIS — R3912 Poor urinary stream: Secondary | ICD-10-CM | POA: Diagnosis not present

## 2016-04-18 DIAGNOSIS — G4730 Sleep apnea, unspecified: Secondary | ICD-10-CM | POA: Insufficient documentation

## 2016-04-18 LAB — URINALYSIS, COMPLETE
Bilirubin, UA: NEGATIVE
Glucose, UA: NEGATIVE
Ketones, UA: NEGATIVE
Leukocytes, UA: NEGATIVE
Nitrite, UA: NEGATIVE
PH UA: 6 (ref 5.0–7.5)
PROTEIN UA: NEGATIVE
RBC UA: NEGATIVE
Specific Gravity, UA: 1.025 (ref 1.005–1.030)
UUROB: 0.2 mg/dL (ref 0.2–1.0)

## 2016-04-18 LAB — MICROSCOPIC EXAMINATION: BACTERIA UA: NONE SEEN

## 2016-04-18 MED ORDER — CIPROFLOXACIN HCL 500 MG PO TABS
500.0000 mg | ORAL_TABLET | Freq: Once | ORAL | Status: AC
  Administered 2016-04-18: 500 mg via ORAL

## 2016-04-18 MED ORDER — LIDOCAINE HCL 2 % EX GEL
1.0000 "application " | Freq: Once | CUTANEOUS | Status: AC
  Administered 2016-04-18: 1 via URETHRAL

## 2016-04-18 NOTE — Progress Notes (Signed)
04/18/2016 6:38 AM   Christopher Campos 10-Sep-1970 [id number]  Referring provider: Idelle Crouch, MD [facility] [place], [place] [address]  No chief complaint on file.   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 Progressive bother from obstructive symptoms 2017.   04/2016 - PVR <40mL, Cr <1.0,  Cysto with recurrence high-grade short segment bulbar stricture (estimate 75F diameter, 3 mm or less length).  2 - Prostate Screening - underwent "for cause" eval in setting of obstructive symtpoms 2017 at age 46 PSA 0.73.   Today "Christopher Campos" is seen for cysto to r/o recurrent urethral stricture.    PMH: Past Medical History:  Diagnosis Date  . Asthma    WELL CONTROLLED  . Chronic kidney disease    H/O STONES  . GERD (gastroesophageal reflux disease)   . Seasonal allergies   . Sleep apnea    NO CPAP    Surgical History: Past Surgical History:  Procedure Laterality Date  . ACHILLES TENDON REPAIR Right   . ESOPHAGOGASTRODUODENOSCOPY    . KIDNEY STONE SURGERY    . NASAL SEPTOPLASTY W/ TURBINOPLASTY Bilateral 01/04/2016   Procedure: NASAL SEPTOPLASTY WITH SUBMUCOUS RESECTION OF INFERIOR TURBINATES;  Surgeon: Margaretha Sheffield, MD;  Location: [facility] ORS;  Service: ENT;  Laterality: Bilateral;  . REMOVAL OF SCAR TISSUE    . TONSILLECTOMY AND ADENOIDECTOMY N/A 01/04/2016   Procedure: TONSILLECTOMY ;  Surgeon: Margaretha Sheffield, MD;  Location: [facility] ORS;  Service: ENT;  Laterality: N/A;    Home Medications:    Medication List       Accurate as of 04/18/16  6:38 AM. Always use your most recent med list.          albuterol 108 (90 Base) MCG/ACT inhaler Commonly known as:  PROVENTIL HFA;VENTOLIN HFA Inhale 2 puffs into the lungs every 6 (six) hours as needed for wheezing or shortness of breath.   albuterol (5 MG/ML) 0.5% nebulizer solution Commonly known as:  PROVENTIL Take 2.5 mg by nebulization  once.   cyclobenzaprine 10 MG tablet Commonly known as:  FLEXERIL Take 10 mg by mouth at bedtime.   esomeprazole 40 MG capsule Commonly known as:  NEXIUM Take 40 mg by mouth every morning.   fluticasone 50 MCG/ACT nasal spray Commonly known as:  FLONASE Place 2 sprays into both nostrils daily.   meloxicam 15 MG tablet Commonly known as:  MOBIC Take 15 mg by mouth daily.   montelukast 10 MG tablet Commonly known as:  SINGULAIR Take 10 mg by mouth every morning.   multivitamin tablet Take 1 tablet by mouth as needed.       Allergies:  Allergies  Allergen Reactions  . Codeine Nausea And Vomiting  . Codeine Sulfate Nausea Only    Family History: No family history on file.  Social History:  reports that he has never smoked. He does not have any smokeless tobacco history on file. He reports that he drinks alcohol. He reports that he does not use drugs.     Review of Systems  Gastrointestinal (upper)  : Negative for upper GI symptoms  Gastrointestinal (lower) : Negative for lower GI symptoms  Constitutional : Negative for symptoms  Skin: Negative for skin symptoms  Eyes: Negative for eye symptoms  Ear/Nose/Throat : Negative for Ear/Nose/Throat symptoms  Hematologic/Lymphatic: Negative for Hematologic/Lymphatic symptoms  Cardiovascular : Negative for cardiovascular symptoms  Respiratory : Negative for respiratory symptoms  Endocrine:  Negative for endocrine symptoms  Musculoskeletal: Negative for musculoskeletal symptoms  Neurological: Negative for neurological symptoms  Psychologic: Negative for psychiatric symptoms   Physical Exam: There were no vitals taken for this visit.  Constitutional:  Alert and oriented, No acute distress. HEENT: [place] AT, moist mucus membranes.  Trachea midline, no masses. Cardiovascular: No clubbing, cyanosis, or edema. Respiratory: Normal respiratory effort, no increased work of breathing. GI: Abdomen is soft,  nontender, nondistended, no abdominal masses GU: No CVA tenderness. Phallus straight, testes down w/o masses.  Skin: No rashes, bruises or suspicious lesions. Lymph: No cervical or inguinal adenopathy. Neurologic: Grossly intact, no focal deficits, moving all 4 extremities. Psychiatric: Normal mood and affect.  Laboratory Data: No results found for: WBC, HGB, HCT, MCV, PLT  No results found for: CREATININE  No results found for: PSA  No results found for: TESTOSTERONE  No results found for: HGBA1C  Urinalysis    Component Value Date/Time   APPEARANCEUR Clear 03/21/2016 1441   GLUCOSEU Negative 03/21/2016 1441   BILIRUBINUR Negative 03/21/2016 1441   PROTEINUR Negative 03/21/2016 1441   NITRITE Negative 03/21/2016 1441   LEUKOCYTESUR Negative 03/21/2016 1441      Cystoscopy Procedure Note  Patient identification was confirmed, informed consent was obtained, and patient was prepped using Betadine solution.  Lidocaine jelly was administered per urethral meatus.    Preoperative abx where received prior to procedure.     Pre-Procedure: - Inspection reveals a normal caliber ureteral meatus.  Procedure: The flexible cystoscope was introduced without difficulty - a high grade sricture is noted with white colored tissue at distal bulbar urethra, estimate 85F diameter.     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 Discussed options of referral for urethroplasty (most definitive, usually preferred in younger patients with 2 or more recurrences) v. OR dilation alone. He opts for dilation. As time course for recurrence has been quite slow this is reasonable.   Risks, benefits, alternatives, expected peri-op course with need for few days foley post-op discussed.   2 - Prostate Screening - up to date for now, restart at age 62 as  average risk.    No Follow-up on file.  Alexis Frock, [facility] [address], [place], [place] [address] [phone]

## 2016-04-19 ENCOUNTER — Other Ambulatory Visit: Payer: Self-pay | Admitting: Radiology

## 2016-04-19 ENCOUNTER — Telehealth: Payer: Self-pay | Admitting: Radiology

## 2016-04-19 DIAGNOSIS — N35919 Unspecified urethral stricture, male, unspecified site: Secondary | ICD-10-CM

## 2016-04-19 NOTE — Telephone Encounter (Signed)
Notified pt of surgery scheduled with Dr Erlene Quan on 05/02/16, pre-admit testing phone interview on 04/22/16 between 9am-1pm & to call Friday prior to surgery for arrival time to SDS. Pt voices understanding.

## 2016-04-22 ENCOUNTER — Inpatient Hospital Stay
Admission: RE | Admit: 2016-04-22 | Discharge: 2016-04-22 | Disposition: A | Discharge status: Home / Self Care | Payer: BLUE CROSS/BLUE SHIELD | Source: Ambulatory Visit

## 2016-04-22 ENCOUNTER — Encounter: Payer: Self-pay | Admitting: *Deleted

## 2016-04-22 DIAGNOSIS — J45909 Unspecified asthma, uncomplicated: Secondary | ICD-10-CM | POA: Diagnosis not present

## 2016-04-22 DIAGNOSIS — G4730 Sleep apnea, unspecified: Secondary | ICD-10-CM | POA: Diagnosis not present

## 2016-04-22 DIAGNOSIS — Z87442 Personal history of urinary calculi: Secondary | ICD-10-CM | POA: Diagnosis not present

## 2016-04-22 DIAGNOSIS — N359 Urethral stricture, unspecified: Secondary | ICD-10-CM | POA: Diagnosis not present

## 2016-04-22 DIAGNOSIS — Z79899 Other long term (current) drug therapy: Secondary | ICD-10-CM | POA: Diagnosis not present

## 2016-04-22 DIAGNOSIS — K219 Gastro-esophageal reflux disease without esophagitis: Secondary | ICD-10-CM | POA: Diagnosis not present

## 2016-04-22 DIAGNOSIS — Z885 Allergy status to narcotic agent status: Secondary | ICD-10-CM | POA: Diagnosis not present

## 2016-04-22 NOTE — Patient Instructions (Signed)
  Your procedure is scheduled on: May 02, 2016 Report to [facility] To find out your arrival time please call [phone] between 1PM - 3PM on Friday, April 29, 2016  Remember: Instructions that are not followed completely may result in serious medical risk, up to and including death, or upon the discretion of your surgeon and anesthesiologist your surgery may need to be rescheduled.    __x__ 1. Do not eat food or drink liquids after midnight. No gum chewing or hard candies.      __x__ 2. No Alcohol for 24 hours before or after surgery.   ____ 3. Do Not Smoke For 24 Hours Prior to Your Surgery.   ____ 4. Bring all medications with you on the day of surgery if instructed.    __x__ 5. Notify your doctor if there is any change in your medical condition     (cold, fever, infections).       Do not wear jewelry, make-up, hairpins, clips or nail polish.  Do not wear lotions, powders, or perfumes. You may wear deodorant.  Do not shave 48 hours prior to surgery. Men may shave face and neck.  Do not bring valuables to the hospital.    [facility] is not responsible for any belongings or valuables.               Contacts, dentures or bridgework may not be worn into surgery.  Leave your suitcase in the car. After surgery it may be brought to your room.  For patients admitted to the hospital, discharge time is determined by your treatment team.   Patients discharged the day of surgery will not be allowed to drive home.        ____ Take these medicines the morning of surgery with A SIP OF WATER:    1. SINGULAIR  2. NEXIUM ON THE NIGHT BEFORE SURGERY AND ON THE DAY OF SURGERY  3. USE THE ALBUTEROL NEBULIZER IN THE MORNING  4. BRING YOUR RESCUE INHALER TO THE HOSPITAL WITH YOU  5. USE THE NASAL SPRAY ON THE MORNING OF SURGERY  6.  ____ Fleet Enema (as directed)   __x_ Use CHG Soap as directed (ANTIMICROBIAL SOAP AT HOME NIGHT BEFORE SURGERY AND AGAIN  ON THE MORNING OF SURGERY  ____ Use NEBULIZER on the day of surgery AND BRING RESCUE INHALER WITH YOU  ____ Stop metformin 2 days prior to surgery    ____ Take 1/2 of usual insulin dose the night before surgery and none on the morning of surgery.   __x__ Stop Coumadin/Plavix/aspirin AS OF TODAY, 04/22/2016  __x__  Stop Anti-inflammatories on such as ibuprofen, meloxicam, aleve AS OF TODAY 04/22/2016   ____  Stop supplements until after surgery.    ____ Bring C-Pap to the hospital.

## 2016-05-02 ENCOUNTER — Ambulatory Visit: Payer: BLUE CROSS/BLUE SHIELD | Admitting: Anesthesiology

## 2016-05-02 ENCOUNTER — Encounter
Admission: RE | Disposition: A | Discharge status: Home / Self Care | Payer: Self-pay | Source: Ambulatory Visit | Attending: Urology

## 2016-05-02 ENCOUNTER — Ambulatory Visit
Admission: RE | Admit: 2016-05-02 | Discharge: 2016-05-02 | Disposition: A | Discharge status: Home / Self Care | Payer: BLUE CROSS/BLUE SHIELD | Source: Ambulatory Visit | Attending: Urology | Admitting: Urology

## 2016-05-02 ENCOUNTER — Encounter: Payer: Self-pay | Admitting: Anesthesiology

## 2016-05-02 DIAGNOSIS — J45909 Unspecified asthma, uncomplicated: Secondary | ICD-10-CM | POA: Insufficient documentation

## 2016-05-02 DIAGNOSIS — K219 Gastro-esophageal reflux disease without esophagitis: Secondary | ICD-10-CM | POA: Insufficient documentation

## 2016-05-02 DIAGNOSIS — N35011 Post-traumatic bulbous urethral stricture: Secondary | ICD-10-CM

## 2016-05-02 DIAGNOSIS — Z87442 Personal history of urinary calculi: Secondary | ICD-10-CM | POA: Insufficient documentation

## 2016-05-02 DIAGNOSIS — G4730 Sleep apnea, unspecified: Secondary | ICD-10-CM | POA: Insufficient documentation

## 2016-05-02 DIAGNOSIS — Z885 Allergy status to narcotic agent status: Secondary | ICD-10-CM | POA: Insufficient documentation

## 2016-05-02 DIAGNOSIS — N99114 Postprocedural urethral stricture, male, unspecified: Secondary | ICD-10-CM

## 2016-05-02 DIAGNOSIS — Z79899 Other long term (current) drug therapy: Secondary | ICD-10-CM | POA: Insufficient documentation

## 2016-05-02 DIAGNOSIS — N359 Urethral stricture, unspecified: Secondary | ICD-10-CM | POA: Insufficient documentation

## 2016-05-02 DIAGNOSIS — N35919 Unspecified urethral stricture, male, unspecified site: Secondary | ICD-10-CM

## 2016-05-02 HISTORY — PX: CYSTOSCOPY WITH URETHRAL DILATATION: SHX5125

## 2016-05-02 SURGERY — CYSTOSCOPY, WITH URETHRAL DILATION
Anesthesia: General | Wound class: Clean Contaminated

## 2016-05-02 MED ORDER — FENTANYL CITRATE (PF) 100 MCG/2ML IJ SOLN
INTRAMUSCULAR | Status: DC | PRN
  Administered 2016-05-02 (×2): 50 ug via INTRAVENOUS

## 2016-05-02 MED ORDER — FENTANYL CITRATE (PF) 100 MCG/2ML IJ SOLN
25.0000 ug | INTRAMUSCULAR | Status: DC | PRN
  Administered 2016-05-02 (×4): 25 ug via INTRAVENOUS

## 2016-05-02 MED ORDER — FENTANYL CITRATE (PF) 100 MCG/2ML IJ SOLN
INTRAMUSCULAR | Status: AC
Start: 2016-05-02 — End: 2016-05-02
  Administered 2016-05-02: 25 ug via INTRAVENOUS
  Filled 2016-05-02: qty 2

## 2016-05-02 MED ORDER — LACTATED RINGERS IV SOLN
INTRAVENOUS | Status: DC
  Administered 2016-05-02 (×2): via INTRAVENOUS

## 2016-05-02 MED ORDER — MIDAZOLAM HCL 2 MG/2ML IJ SOLN
INTRAMUSCULAR | Status: DC | PRN
  Administered 2016-05-02: 2 mg via INTRAVENOUS

## 2016-05-02 MED ORDER — HYDROCODONE-ACETAMINOPHEN 5-325 MG PO TABS: 1.0000 | ORAL_TABLET | Freq: Four times a day (QID) | ORAL | 0 refills | Status: DC | PRN

## 2016-05-02 MED ORDER — PROPOFOL 10 MG/ML IV BOLUS
INTRAVENOUS | Status: DC | PRN
  Administered 2016-05-02: 160 mg via INTRAVENOUS
  Administered 2016-05-02: 40 mg via INTRAVENOUS

## 2016-05-02 MED ORDER — OXYBUTYNIN CHLORIDE 5 MG PO TABS: 5.0000 mg | ORAL_TABLET | Freq: Three times a day (TID) | ORAL | 0 refills | Status: DC | PRN

## 2016-05-02 MED ORDER — CEFAZOLIN IN D5W 1 GM/50ML IV SOLN
INTRAVENOUS | Status: AC
  Administered 2016-05-02: 1 g via INTRAVENOUS
  Filled 2016-05-02: qty 50

## 2016-05-02 MED ORDER — LIDOCAINE HCL (CARDIAC) 20 MG/ML IV SOLN
INTRAVENOUS | Status: DC | PRN
  Administered 2016-05-02: 100 mg via INTRAVENOUS

## 2016-05-02 MED ORDER — ONDANSETRON HCL 4 MG/2ML IJ SOLN: 4.0000 mg | Freq: Once | INTRAMUSCULAR | Status: DC | PRN

## 2016-05-02 MED ORDER — CEFAZOLIN IN D5W 1 GM/50ML IV SOLN
1.0000 g | INTRAVENOUS | Status: AC
  Administered 2016-05-02: 1 g via INTRAVENOUS

## 2016-05-02 MED ORDER — ONDANSETRON HCL 4 MG/2ML IJ SOLN
INTRAMUSCULAR | Status: DC | PRN
  Administered 2016-05-02 (×2): 4 mg via INTRAVENOUS

## 2016-05-02 SURGICAL SUPPLY — 18 items
CATH FOL 2WAY LX 16X5 (CATHETERS) ×3 IMPLANT
CATH URETHRAL DIL 7.0X29 (CATHETERS) ×3 IMPLANT
ELECT REM PT RETURN 9FT ADLT (ELECTROSURGICAL)
ELECTRODE REM PT RTRN 9FT ADLT (ELECTROSURGICAL) IMPLANT
GLOVE BIO SURGEON STRL SZ 6.5 (GLOVE) ×2 IMPLANT
GLOVE BIO SURGEON STRL SZ7 (GLOVE) ×6 IMPLANT
GLOVE BIO SURGEONS STRL SZ 6.5 (GLOVE) ×1
GOWN STRL REUS W/ TWL LRG LVL3 (GOWN DISPOSABLE) ×2 IMPLANT
GOWN STRL REUS W/TWL LRG LVL3 (GOWN DISPOSABLE) ×4
PACK CYSTO AR (MISCELLANEOUS) ×3 IMPLANT
PREP PVP WINGED SPONGE (MISCELLANEOUS) IMPLANT
SENSORWIRE 0.038 NOT ANGLED (WIRE) ×3
SET CYSTO W/LG BORE CLAMP LF (SET/KITS/TRAYS/PACK) ×6 IMPLANT
SYR 30ML LL (SYRINGE) ×3 IMPLANT
SYRINGE 10CC LL (SYRINGE) ×3 IMPLANT
WATER STERILE IRR 1000ML POUR (IV SOLUTION) ×3 IMPLANT
WATER STERILE IRR 3000ML UROMA (IV SOLUTION) ×3 IMPLANT
WIRE SENSOR 0.038 NOT ANGLED (WIRE) ×1 IMPLANT

## 2016-05-02 NOTE — Anesthesia Procedure Notes (Signed)
Procedure Name: LMA Insertion Date/Time: 05/02/2016 7:49 AM Performed by: Leander Rams Pre-anesthesia Checklist: Patient identified, Emergency Drugs available, Suction available, Patient being monitored and Timeout performed Patient Re-evaluated:Patient Re-evaluated prior to inductionOxygen Delivery Method: Circle system utilized Preoxygenation: Pre-oxygenation with 100% oxygen Intubation Type: IV induction LMA: LMA inserted LMA Size: 4.5 Number of attempts: 1 Placement Confirmation: positive ETCO2,  CO2 detector and breath sounds checked- equal and bilateral Dental Injury: Teeth and Oropharynx as per pre-operative assessment

## 2016-05-02 NOTE — Transfer of Care (Signed)
Immediate Anesthesia Transfer of Care Note  Patient: Christopher Campos  Procedure(s) Performed: Procedure(s): CYSTOSCOPY WITH URETHRAL DILATATION (N/A)  Patient Location: PACU  Anesthesia Type:General  Level of Consciousness: awake  Airway & Oxygen Therapy: Patient Spontanous Breathing  Post-op Assessment: Report given to RN  Post vital signs: Reviewed  Last Vitals:  Vitals:   05/02/16 0642 05/02/16 0822  BP: 123/83 130/90  Pulse: 79 92  Resp: 18 16  Temp: 36.6 C 36.3 C    Last Pain:  Vitals:   05/02/16 0642  TempSrc: Oral         Complications: No apparent anesthesia complications

## 2016-05-02 NOTE — Anesthesia Postprocedure Evaluation (Signed)
Anesthesia Post Note  Patient: Christopher Campos  Procedure(s) Performed: Procedure(s) (LRB): CYSTOSCOPY WITH URETHRAL DILATATION (N/A)  Patient location during evaluation: PACU Anesthesia Type: General Level of consciousness: awake and alert Pain management: pain level controlled Vital Signs Assessment: post-procedure vital signs reviewed and stable Respiratory status: spontaneous breathing, nonlabored ventilation, respiratory function stable and patient connected to nasal cannula oxygen Cardiovascular status: blood pressure returned to baseline and stable Postop Assessment: no signs of nausea or vomiting Anesthetic complications: no    Last Vitals:  Vitals:   05/02/16 0935 05/02/16 1009  BP: 127/85 125/80  Pulse: 60 65  Resp: 16 16  Temp: 36.5 C     Last Pain:  Vitals:   05/02/16 0935  TempSrc: Oral  PainSc:                  Yuriy Cui S

## 2016-05-02 NOTE — Interval H&P Note (Signed)
History and Physical Interval Note:  05/02/2016 7:18 AM  Christopher Campos  has presented today for surgery, with the diagnosis of uretheral stone  The various methods of treatment have been discussed with the patient and family. After consideration of risks, benefits and other options for treatment, the patient has consented to  Procedure(s): CYSTOSCOPY WITH URETHRAL DILATATION (N/A) as a surgical intervention .  The patient's history has been reviewed, patient examined, no change in status, stable for surgery.  I have reviewed the patient's chart and labs.  Questions were answered to the patient's satisfaction.    RRR CTAB   Hollice Espy

## 2016-05-02 NOTE — Discharge Instructions (Signed)
Urethrotomy, Male, Care After Refer to this sheet in the next few weeks. These instructions provide you with information on caring for yourself after your procedure. Your health care provider may also give you more specific instructions. Your treatment has been planned according to current medical practices, but problems sometimes occur. Call your health care provider if you have any problems or questions after your procedure. WHAT TO EXPECT AFTER THE PROCEDURE  After your procedure, it is typical to have the following:   Pain.  Burning pain when passing urine.  A small amount of blood in your urine.  Bloody urine leaking from around your catheter. HOME CARE INSTRUCTIONS Follow all your health care provider's home care instructions carefully. These may include:  Take all medicines as directed by your health care provider.  Follow catheter care as prescribed by your surgeon.  You may have a gauze pad on the tip of your penis. Change this pad often to keep it clean and dry. You can take a shower with your catheter in place.  Do not bathe, swim, or use a hot tub until after your catheter is removed.  Return to your health care provider as directed to have your catheter removed.  If you have to do self-catheterization after your catheter is taken out, make sure you understand the procedure completely. Follow your instructions carefully. Ask your health care provider if you have any questions.  You may eat what you usually do.  Drink enough fluid to keep your urine clear or pale yellow.  Take at least two 10-minute walks each day.  Do not lift anything heavier than 10 lb (4.5 kg) until directed by your health care provider.  Ask your health care provider when you can go back to work and do all your usual activities, including sex.  Schedule and attend follow-up visits as directed by your health care provider. It is important to keep all your appointments. SEEK MEDICAL CARE  IF:  You have chills.  You have a fever.  Your pain is not relieved by medicine.  You continue to have blood in your urine longer than expected by your health care provider. SEEK IMMEDIATE MEDICAL CARE IF:  Your pain gets worse and is not relieved by medicine.  You have heavy bleeding or clots in your urine.  You have trouble passing urine after your catheter is removed.  You have chest pain or trouble breathing.   This information is not intended to replace advice given to you by your health care provider. Make sure you discuss any questions you have with your health care provider.   Document Released: 07/23/2013 Document Revised: 12/02/2014 Document Reviewed: 07/23/2013 Elsevier Interactive Patient Education 2016 [place], Adult A Foley catheter is a soft, flexible tube. This tube is placed into your bladder to drain pee (urine). If you go home with this catheter in place, follow the instructions below. TAKING CARE OF THE CATHETER 1. Wash your hands with soap and water. 2. Put soap and water on a clean washcloth.  Clean the skin where the tube goes into your body.  Clean away from the tube site.  Never wipe toward the tube.  Clean the area using a circular motion.  Remove all the soap. Pat the area dry with a clean towel. For males, reposition the skin that covers the end of the penis (foreskin). 3. Attach the tube to your leg with tape or a leg strap. Do not stretch the tube tight. If  you are using tape, remove any stickiness left behind by past tape you used. 4. Keep the drainage bag below your hips. Keep it off the floor. 5. Check your tube during the day. Make sure it is working and draining. Make sure the tube does not curl, twist, or bend. 6. Do not pull on the tube or try to take it out. TAKING CARE OF THE DRAINAGE BAGS You will have a large overnight drainage bag and a small leg bag. You may wear the overnight bag any time. Never wear the  small bag at night. Follow the directions below. Emptying the Drainage Bag Empty your drainage bag when it is  - full or at least 2-3 times a day. 1. Wash your hands with soap and water. 2. Keep the drainage bag below your hips. 3. Hold the dirty bag over the toilet or clean container. 4. Open the pour spout at the bottom of the bag. Empty the pee into the toilet or container. Do not let the pour spout touch anything. 5. Clean the pour spout with a gauze pad or cotton ball that has rubbing alcohol on it. 6. Close the pour spout. 7. Attach the bag to your leg with tape or a leg strap. 8. Wash your hands well. Changing the Drainage Bag Change your bag once a month or sooner if it starts to smell or look dirty.  1. Wash your hands with soap and water. 2. Pinch the rubber tube so that pee does not spill out. 3. Disconnect the catheter tube from the drainage tube at the connection valve. Do not let the tubes touch anything. 4. Clean the end of the catheter tube with an alcohol wipe. Clean the end of a the drainage tube with a different alcohol wipe. 5. Connect the catheter tube to the drainage tube of the clean drainage bag. 6. Attach the new bag to the leg with tape or a leg strap. Avoid attaching the new bag too tightly. 7. Wash your hands well. Cleaning the Drainage Bag 1. Wash your hands with soap and water. 2. Wash the bag in warm, soapy water. 3. Rinse the bag with warm water. 4. Fill the bag with a mixture of white vinegar and water (1 cup vinegar to 1 quart warm water [.2 liter vinegar to 1 liter warm water]). Close the bag and soak it for 30 minutes in the solution. 5. Rinse the bag with warm water. 6. Hang the bag to dry with the pour spout open and hanging downward. 7. Store the clean bag (once it is dry) in a clean plastic bag. 8. Wash your hands well. PREVENT INFECTION  Wash your hands before and after touching your tube.  Take showers every day. Wash the skin where the tube  enters your body. Do not take baths. Replace wet leg straps with dry ones, if this applies.  Do not use powders, sprays, or lotions on the genital area. Only use creams, lotions, or ointments as told by your doctor.  For females, wipe from front to back after going to the bathroom.  Drink enough fluids to keep your pee clear or pale yellow unless you are told not to have too much fluid (fluid restriction).  Do not let the drainage bag or tubing touch or lie on the floor.  Wear cotton underwear to keep the area dry. GET HELP IF:  Your pee is cloudy or smells unusually bad.  Your tube becomes clogged.  You are not draining pee  into the bag or your bladder feels full.  Your tube starts to leak. GET HELP RIGHT AWAY IF:  You have pain, puffiness (swelling), redness, or yellowish-white fluid (pus) where the tube enters the body.  You have pain in the belly (abdomen), legs, lower back, or bladder.  You have a fever.  You see blood fill the tube, or your pee is pink or red.  You feel sick to your stomach (nauseous), throw up (vomit), or have chills.  Your tube gets pulled out. MAKE SURE YOU:   Understand these instructions.  Will watch your condition.  Will get help right away if you are not doing well or get worse.   This information is not intended to replace advice given to you by your health care provider. Make sure you discuss any questions you have with your health care provider.   Document Released: 11/12/2012 Document Revised: 08/08/2014 Document Reviewed: 11/12/2012 Elsevier Interactive Patient Education 2016 [place]   1) The drugs that you were given will stay in your system until tomorrow so for the next 24 hours you should not:  A) Drive an automobile B) Make any legal decisions C) Drink any alcoholic beverage   2) You may resume regular meals tomorrow.  Today it is better to start with liquids and  gradually work up to solid foods.  You may eat anything you prefer, but it is better to start with liquids, then soup and crackers, and gradually work up to solid foods.   3) Please notify your doctor immediately if you have any unusual bleeding, trouble breathing, redness and pain at the surgery site, drainage, fever, or pain not relieved by medication.    4) Additional Instructions:        Please contact your physician with any problems or Same Day Surgery at [phone], Monday through Friday 6 am to 4 pm, or [place] at [facility] number at [phone].

## 2016-05-02 NOTE — Anesthesia Preprocedure Evaluation (Signed)
Anesthesia Evaluation  Patient identified by MRN, date of birth, ID band Patient awake    Reviewed: Allergy & Precautions, NPO status , Patient's Chart, lab work & pertinent test results, reviewed documented beta blocker date and time   Airway Mallampati: III  TM Distance: >3 FB     Dental  (+) Chipped   Pulmonary asthma , sleep apnea ,           Cardiovascular      Neuro/Psych    GI/Hepatic GERD  Controlled,  Endo/Other    Renal/GU Renal disease     Musculoskeletal   Abdominal   Peds  Hematology   Anesthesia Other Findings   Reproductive/Obstetrics                             Anesthesia Physical Anesthesia Plan  ASA: III  Anesthesia Plan: General   Post-op Pain Management:    Induction: Intravenous  Airway Management Planned: LMA  Additional Equipment:   Intra-op Plan:   Post-operative Plan:   Informed Consent: I have reviewed the patients History and Physical, chart, labs and discussed the procedure including the risks, benefits and alternatives for the proposed anesthesia with the patient or authorized representative who has indicated his/her understanding and acceptance.     Plan Discussed with: CRNA  Anesthesia Plan Comments:         Anesthesia Quick Evaluation

## 2016-05-02 NOTE — H&P (View-Only) (Signed)
04/18/2016 6:38 AM   Christopher Campos 1971-05-12 [id number]  Referring provider: Idelle Crouch, MD [facility] [place], [place] [address]  No chief complaint on file.   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 Progressive bother from obstructive symptoms 2017.   04/2016 - PVR <42mL, Cr <1.0,  Cysto with recurrence high-grade short segment bulbar stricture (estimate 29F diameter, 3 mm or less length).  2 - Prostate Screening - underwent "for cause" eval in setting of obstructive symtpoms 2017 at age 42 PSA 0.73.   Today "Christopher Campos" is seen for cysto to r/o recurrent urethral stricture.    PMH: Past Medical History:  Diagnosis Date  . Asthma    WELL CONTROLLED  . Chronic kidney disease    H/O STONES  . GERD (gastroesophageal reflux disease)   . Seasonal allergies   . Sleep apnea    NO CPAP    Surgical History: Past Surgical History:  Procedure Laterality Date  . ACHILLES TENDON REPAIR Right   . ESOPHAGOGASTRODUODENOSCOPY    . KIDNEY STONE SURGERY    . NASAL SEPTOPLASTY W/ TURBINOPLASTY Bilateral 01/04/2016   Procedure: NASAL SEPTOPLASTY WITH SUBMUCOUS RESECTION OF INFERIOR TURBINATES;  Surgeon: Margaretha Sheffield, MD;  Location: [facility] ORS;  Service: ENT;  Laterality: Bilateral;  . REMOVAL OF SCAR TISSUE    . TONSILLECTOMY AND ADENOIDECTOMY N/A 01/04/2016   Procedure: TONSILLECTOMY ;  Surgeon: Margaretha Sheffield, MD;  Location: [facility] ORS;  Service: ENT;  Laterality: N/A;    Home Medications:    Medication List       Accurate as of 04/18/16  6:38 AM. Always use your most recent med list.          albuterol 108 (90 Base) MCG/ACT inhaler Commonly known as:  PROVENTIL HFA;VENTOLIN HFA Inhale 2 puffs into the lungs every 6 (six) hours as needed for wheezing or shortness of breath.   albuterol (5 MG/ML) 0.5% nebulizer solution Commonly known as:  PROVENTIL Take 2.5 mg by nebulization  once.   cyclobenzaprine 10 MG tablet Commonly known as:  FLEXERIL Take 10 mg by mouth at bedtime.   esomeprazole 40 MG capsule Commonly known as:  NEXIUM Take 40 mg by mouth every morning.   fluticasone 50 MCG/ACT nasal spray Commonly known as:  FLONASE Place 2 sprays into both nostrils daily.   meloxicam 15 MG tablet Commonly known as:  MOBIC Take 15 mg by mouth daily.   montelukast 10 MG tablet Commonly known as:  SINGULAIR Take 10 mg by mouth every morning.   multivitamin tablet Take 1 tablet by mouth as needed.       Allergies:  Allergies  Allergen Reactions  . Codeine Nausea And Vomiting  . Codeine Sulfate Nausea Only    Family History: No family history on file.  Social History:  reports that he has never smoked. He does not have any smokeless tobacco history on file. He reports that he drinks alcohol. He reports that he does not use drugs.     Review of Systems  Gastrointestinal (upper)  : Negative for upper GI symptoms  Gastrointestinal (lower) : Negative for lower GI symptoms  Constitutional : Negative for symptoms  Skin: Negative for skin symptoms  Eyes: Negative for eye symptoms  Ear/Nose/Throat : Negative for Ear/Nose/Throat symptoms  Hematologic/Lymphatic: Negative for Hematologic/Lymphatic symptoms  Cardiovascular : Negative for cardiovascular symptoms  Respiratory : Negative for respiratory symptoms  Endocrine:  Negative for endocrine symptoms  Musculoskeletal: Negative for musculoskeletal symptoms  Neurological: Negative for neurological symptoms  Psychologic: Negative for psychiatric symptoms   Physical Exam: There were no vitals taken for this visit.  Constitutional:  Alert and oriented, No acute distress. HEENT: [place] AT, moist mucus membranes.  Trachea midline, no masses. Cardiovascular: No clubbing, cyanosis, or edema. Respiratory: Normal respiratory effort, no increased work of breathing. GI: Abdomen is soft,  nontender, nondistended, no abdominal masses GU: No CVA tenderness. Phallus straight, testes down w/o masses.  Skin: No rashes, bruises or suspicious lesions. Lymph: No cervical or inguinal adenopathy. Neurologic: Grossly intact, no focal deficits, moving all 4 extremities. Psychiatric: Normal mood and affect.  Laboratory Data: No results found for: WBC, HGB, HCT, MCV, PLT  No results found for: CREATININE  No results found for: PSA  No results found for: TESTOSTERONE  No results found for: HGBA1C  Urinalysis    Component Value Date/Time   APPEARANCEUR Clear 03/21/2016 1441   GLUCOSEU Negative 03/21/2016 1441   BILIRUBINUR Negative 03/21/2016 1441   PROTEINUR Negative 03/21/2016 1441   NITRITE Negative 03/21/2016 1441   LEUKOCYTESUR Negative 03/21/2016 1441      Cystoscopy Procedure Note  Patient identification was confirmed, informed consent was obtained, and patient was prepped using Betadine solution.  Lidocaine jelly was administered per urethral meatus.    Preoperative abx where received prior to procedure.     Pre-Procedure: - Inspection reveals a normal caliber ureteral meatus.  Procedure: The flexible cystoscope was introduced without difficulty - a high grade sricture is noted with white colored tissue at distal bulbar urethra, estimate 40F diameter.     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 Discussed options of referral for urethroplasty (most definitive, usually preferred in younger patients with 2 or more recurrences) v. OR dilation alone. He opts for dilation. As time course for recurrence has been quite slow this is reasonable.   Risks, benefits, alternatives, expected peri-op course with need for few days foley post-op discussed.   2 - Prostate Screening - up to date for now, restart at age 83 as  average risk.    No Follow-up on file.  Alexis Frock, [facility] [address], [place], [place] [address] [phone]

## 2016-05-02 NOTE — Op Note (Signed)
Date of procedure: 05/02/16  Preoperative diagnosis:  1. Post procedural bulbar urethral stricture   Postoperative diagnosis:  1. Same as above   Procedure: 1. Cystoscopy 2. Balloon dilation of bulbar urethral stricture 3. Foley catheter placement  Surgeon: Hollice Espy, MD  Anesthesia: General  Complications: None  Intraoperative findings: 8 French bulbar urethral stricture, approximately 3 mm in length, soft/nondense  EBL: minimal  Specimens: None  Drains: 60 French council tip catheter  Indication: Christopher Campos is a 45 y.o. patient with recurrent post procedural bulbar urethral stricture proximally 8 French diameter relatively short in length. Options including definitive management with urethroplasty were discussed but declined. He would like to undergo bulbar urethral dilation.  After reviewing the management options for treatment, he elected to proceed with the above surgical procedure(s). We have discussed the potential benefits and risks of the procedure, side effects of the proposed treatment, the likelihood of the patient achieving the goals of the procedure, and any potential problems that might occur during the procedure or recuperation. Informed consent has been obtained.  Description of procedure:  The patient was taken to the operating room and general anesthesia was induced.  The patient was placed in the dorsal lithotomy position, prepped and draped in the usual sterile fashion, and preoperative antibiotics were administered. A preoperative time-out was performed.   A 21 French cystoscope was advanced per urethra until the stricture was encountered within the bulbar urethra measuring approximately 8 [place] and was only approximately 3 mm in length. It appeared to be relatively soft and not particularly fibrotic. Under direct visualization, a sensor wire was advanced into the bladder. The scope was then removed. A Cook urethral dilator balloon was then advanced  over the wire past the area of the stricture. The balloon was filled with 8 cc of sterile water and allowed to remain in place for several minutes to facilitate dilation. The balloon was then taken down and removed. There was a small amount of bleeding. The scope was then advanced alongside of the wire in the defect was appreciated. The scope was then passed into the bladder without difficulty. The prostate was relatively short without significant bilobar coaptation. The bladder itself was normal with normal urothelium, no trabeculation, normal trigone, non-elevated bladder neck, and no mucosal lesions. Bilateral UOs were in anatomic position. The scope was then removed. An 76 [place] council tip catheter was advanced over the wire into the bladder. The balloon was filled with 10 cc of sterile water. The wire was removed. The patient was cleaned and dried. The catheter was attached to a Foley bag. The patient was then reversed from anesthesia, taken to the PACU in stable condition.  Plan: Patient will follow-up in 1 week for catheter removal. He will call our office if he is not able to tolerate a full week of Foley catheterization is quite anxious about this. He is offered to learn clean intermittent catheterization to be performed daily at his follow-up appointment which will he will consider.  He'll follow up with Dr. Tresa Moore or myself in 3 months for symptoms recheck.    Hollice Espy, M.D.

## 2016-05-05 ENCOUNTER — Telehealth: Payer: Self-pay

## 2016-05-05 ENCOUNTER — Ambulatory Visit: Payer: BLUE CROSS/BLUE SHIELD

## 2016-05-05 DIAGNOSIS — N99114 Postprocedural urethral stricture, male, unspecified: Secondary | ICD-10-CM

## 2016-05-05 NOTE — Progress Notes (Signed)
Catheter Removal  Patient is present today for a catheter removal.  56ml of water was drained from the balloon. A 20FR foley cath was removed from the bladder no complications were noted . Patient tolerated well.  Preformed by: Toniann Fail, LPN   Follow up/ Additional notes: Reinforced with pt the need of catheter or performing CIC. Pt refused to learn CIC and demanded catheter be removed now. Reinforced with pt should he have trouble urinating or not able to urinate to come back. Pt voiced understanding.

## 2016-05-05 NOTE — Telephone Encounter (Signed)
Patient and patient's wife called stating that he cannot tolerate is foley cath and want it removed. It was explained to the patient per Dr. Cherrie Gauze note to keep foley in until Monday or at least Friday , patient refused.  It was explained in detail to patient and his wife the risks involved in removing the catheter early and he states he understands but wants the cath out now.  Patient also was told that it was recommended to learn CIC to help reduce the risk of stricture reforming, patient again refused and states he understands this risk but is unwilling to learn. Patient was added to the nurse schedule for foley removal today.

## 2016-05-09 ENCOUNTER — Ambulatory Visit: Payer: BLUE CROSS/BLUE SHIELD

## 2016-08-02 ENCOUNTER — Ambulatory Visit: Payer: BLUE CROSS/BLUE SHIELD | Admitting: Urology

## 2016-08-02 ENCOUNTER — Encounter: Payer: Self-pay | Admitting: Urology

## 2016-10-03 ENCOUNTER — Encounter: Payer: Self-pay | Admitting: Emergency Medicine

## 2016-10-03 ENCOUNTER — Emergency Department
Admission: EM | Admit: 2016-10-03 | Discharge: 2016-10-03 | Disposition: A | Discharge status: Home / Self Care | Payer: BLUE CROSS/BLUE SHIELD | Attending: Emergency Medicine | Admitting: Emergency Medicine

## 2016-10-03 DIAGNOSIS — J45909 Unspecified asthma, uncomplicated: Secondary | ICD-10-CM | POA: Insufficient documentation

## 2016-10-03 DIAGNOSIS — Y92410 Unspecified street and highway as the place of occurrence of the external cause: Secondary | ICD-10-CM | POA: Diagnosis not present

## 2016-10-03 DIAGNOSIS — N189 Chronic kidney disease, unspecified: Secondary | ICD-10-CM | POA: Insufficient documentation

## 2016-10-03 DIAGNOSIS — Y999 Unspecified external cause status: Secondary | ICD-10-CM | POA: Insufficient documentation

## 2016-10-03 DIAGNOSIS — S39012A Strain of muscle, fascia and tendon of lower back, initial encounter: Secondary | ICD-10-CM | POA: Diagnosis not present

## 2016-10-03 DIAGNOSIS — Y939 Activity, unspecified: Secondary | ICD-10-CM | POA: Insufficient documentation

## 2016-10-03 DIAGNOSIS — S3992XA Unspecified injury of lower back, initial encounter: Secondary | ICD-10-CM | POA: Diagnosis present

## 2016-10-03 MED ORDER — DIAZEPAM 5 MG PO TABS: 2.5000 mg | ORAL_TABLET | Freq: Three times a day (TID) | ORAL | 0 refills | Status: AC | PRN

## 2016-10-03 MED ORDER — KETOROLAC TROMETHAMINE 60 MG/2ML IM SOLN
30.0000 mg | Freq: Once | INTRAMUSCULAR | Status: AC
  Administered 2016-10-03: 30 mg via INTRAMUSCULAR
  Filled 2016-10-03: qty 2
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
## 2016-10-03 NOTE — Discharge Instructions (Signed)
Please continue meloxicam daily. Take Valium 2.5-5 mg every 8 hours as needed for muscle tightness and spasms. Follow-up with your orthopedist or primary care doctor in 3-4 days if no improvement. Return to the ER for any worsening symptoms urgent changes in her health.
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
## 2018-10-25 DIAGNOSIS — Z9109 Other allergy status, other than to drugs and biological substances: Secondary | ICD-10-CM | POA: Insufficient documentation

## 2020-03-05 ENCOUNTER — Other Ambulatory Visit: Payer: Self-pay

## 2020-03-05 ENCOUNTER — Ambulatory Visit: Payer: BC Managed Care – PPO | Admitting: Dermatology

## 2020-03-05 DIAGNOSIS — L578 Other skin changes due to chronic exposure to nonionizing radiation: Secondary | ICD-10-CM | POA: Diagnosis not present

## 2020-03-05 DIAGNOSIS — L570 Actinic keratosis: Secondary | ICD-10-CM

## 2020-03-05 DIAGNOSIS — L821 Other seborrheic keratosis: Secondary | ICD-10-CM | POA: Diagnosis not present

## 2020-03-05 NOTE — Patient Instructions (Signed)
Actinic Keratosis  What is an actinic keratosis? An actinic keratosis (plural: actinic keratoses) is growth on the surface of the skin that usually appears as a red, hard, crusty or scaly bump.  What causes actinic keratoses? Repeated prolonged sun exposure causes skin damage, especially in fair-skinned persons. Sun-damaged skin becomes dry and wrinkled and may form rough, scaly spots called actinic keratoses. These rough spots remain on the skin even though the crust or scale on top is picked off.  Why treat actinic keratoses? Actinic keratoses are not skin cancers, but because they may sometimes turn cancerous they are called "pre-cancerous". Not all will turn to skin cancer, and it usually takes several years for this to happen. Because it is much easier to treat an actinic keratosis then it is to remove a skin cancer, actinic keratoses should be treated to prevent future skin cancer.  How are actinic keratoses treated? The most common way of treating actinic keratoses is to freeze them with liquid nitrogen. Freezing causes scabbing and shedding of the sun-damaged skin. Healing after a removal usually takes two weeks, depending on the size and location of the keratosis. Hands and legs heal more slowly than the face. The skin's final appearance is usually excellent. There are several topical medications that can be used to treat actinic keratoses. These medications generally have side effects of redness, crusting, and pain.Some are used for a few days, and some for several months before the actinic keratosis is completely gone. Photodynamic therapy is another alternative to freezing actinic keratoses.This treatment is done in a physician's office.A medication is applied to the area of skin with actinic keratoses, and it is allowed to soak in for one or more hours. A special light is then applied to the skin.Side effects include redness, burning, and peeling.  How can you prevent actinic  keratoses? Protection from the sun is the best way to prevent actinic keratoses.The use of proper clothing and sunscreens can prevent the sun damage that leads to an actinic keratosis. Unfortunately, some sun damage is permanent. Once sun damage has progressed to the point where actinic keratoses develop, new keratoses may appear even without further sun exposure. However, even in skin that is already heavily sun damaged, good sun protection can help reduce the number of actinic keratoses that will appear.    Cryotherapy Aftercare  . Wash gently with soap and water everyday.   Marland Kitchen Apply Vaseline and Band-Aid daily until healed.

## 2020-03-05 NOTE — Progress Notes (Signed)
   Follow-Up Visit   Subjective  Christopher Campos is a 49 y.o. male who presents for the following: check spot (nose, 6-66m, no symptoms).  The following portions of the chart were reviewed this encounter and updated as appropriate:  Tobacco  Allergies  Meds  Problems  Med Hx  Surg Hx  Fam Hx     Review of Systems:  No other skin or systemic complaints except as noted in HPI or Assessment and Plan.  Objective  Well appearing patient in no apparent distress; mood and affect are within normal limits.  A focused examination was performed including face. Relevant physical exam findings are noted in the Assessment and Plan.  Objective  L nose x 1: Pink scaly macules     Assessment & Plan    Seborrheic Keratoses - Stuck-on, waxy, tan-brown papules and plaques  - Discussed benign etiology and prognosis. - Observe - Call for any changes  Actinic Damage - diffuse scaly erythematous macules with underlying dyspigmentation - Recommend daily broad spectrum sunscreen SPF 30+ to sun-exposed areas, reapply every 2 hours as needed.  - Call for new or changing lesions.   AK (actinic keratosis) L nose x 1  Destruction of lesion - L nose x 1 Complexity: simple   Destruction method: cryotherapy   Informed consent: discussed and consent obtained   Timeout:  patient name, date of birth, surgical site, and procedure verified Lesion destroyed using liquid nitrogen: Yes   Region frozen until ice ball extended beyond lesion: Yes   Outcome: patient tolerated procedure well with no complications   Post-procedure details: wound care instructions given    Return in about 6 months (around 09/05/2020) for recheck AK.  I, Othelia Pulling, RMA, am acting as scribe for Sarina Ser, MD .  Documentation: I have reviewed the above documentation for accuracy and completeness, and I agree with the above.  Sarina Ser, MD

## 2020-03-07 ENCOUNTER — Encounter: Payer: Self-pay | Admitting: Dermatology

## 2020-09-10 ENCOUNTER — Ambulatory Visit: Payer: BC Managed Care – PPO | Admitting: Dermatology

## 2022-02-02 ENCOUNTER — Emergency Department: Payer: BC Managed Care – PPO

## 2022-02-02 ENCOUNTER — Emergency Department
Admission: EM | Admit: 2022-02-02 | Discharge: 2022-02-02 | Disposition: A | Discharge status: Home / Self Care | Payer: BC Managed Care – PPO | Attending: Emergency Medicine | Admitting: Emergency Medicine

## 2022-02-02 ENCOUNTER — Encounter: Payer: Self-pay | Admitting: Medical Oncology

## 2022-02-02 DIAGNOSIS — Z23 Encounter for immunization: Secondary | ICD-10-CM | POA: Diagnosis not present

## 2022-02-02 DIAGNOSIS — W268XXA Contact with other sharp object(s), not elsewhere classified, initial encounter: Secondary | ICD-10-CM | POA: Insufficient documentation

## 2022-02-02 DIAGNOSIS — S60941A Unspecified superficial injury of left index finger, initial encounter: Secondary | ICD-10-CM | POA: Diagnosis present

## 2022-02-02 DIAGNOSIS — S61211A Laceration without foreign body of left index finger without damage to nail, initial encounter: Secondary | ICD-10-CM | POA: Diagnosis not present

## 2022-02-02 MED ORDER — LIDOCAINE HCL (PF) 1 % IJ SOLN
5.0000 mL | Freq: Once | INTRAMUSCULAR | Status: DC
  Filled 2022-02-02: qty 5

## 2022-02-02 MED ORDER — TETANUS-DIPHTH-ACELL PERTUSSIS 5-2.5-18.5 LF-MCG/0.5 IM SUSY
0.5000 mL | PREFILLED_SYRINGE | Freq: Once | INTRAMUSCULAR | Status: AC
  Administered 2022-02-02: 0.5 mL via INTRAMUSCULAR
  Filled 2022-02-02: qty 0.5

## 2022-02-02 NOTE — Discharge Instructions (Addendum)
Keep the wound clean, dry, and covered. Use soap & water to cleanse the wound as necessary.

## 2022-02-02 NOTE — ED Notes (Signed)
Pt cut on left index finger

## 2022-02-02 NOTE — ED Triage Notes (Signed)
Pt reports that he was using a chop saw today when he cut the tip of his left hand index finger. Pt reports unk last tetanus.

## 2022-02-02 NOTE — ED Provider Triage Note (Signed)
Emergency Medicine Provider Triage Evaluation Note  Christopher Campos , a 51 y.o. male  was evaluated in triage.  Pt complains of left index finger laceration from a chop saw.   Review of Systems  Positive: Lac to finger Negative: amputation  Physical Exam  There were no vitals taken for this visit. Gen:   Awake, no distress   Resp:  Normal effort  MSK:   Moves extremities without difficulty  Other:  Left index finger lac through pad of finger, exam limited in triage  Medical Decision Making  Medically screening exam initiated at 3:37 PM.  Appropriate orders placed.  Christopher Campos was informed that the remainder of the evaluation will be completed by another provider, this initial triage assessment does not replace that evaluation, and the importance of remaining in the ED until their evaluation is complete.     Marquette Old, PA-C 02/02/22 1539

## 2022-02-02 NOTE — ED Provider Notes (Signed)
[facility] Emergency Department Provider Note     Event Date/Time   First MD Initiated Contact with Patient 02/02/22 1753     (approximate)   History   Laceration   HPI  Christopher Campos is a 51 y.o. male with a noncontributory medical history, presents to the ED for evaluation of an accidental laceration to the left index finger.  Patient was using a chop saw today, when he accidentally cut the lateral tip of his index finger.  He denies any other injury at this time.  He is on aware of his current tetanus status.     Physical Exam   Triage Vital Signs: ED Triage Vitals [02/02/22 1542]  Enc Vitals Group     BP (!) 148/106     Pulse Rate 80     Resp 20     Temp 98.2 F (36.8 C)     Temp Source Oral     SpO2 96 %     Weight 200 lb (90.7 kg)     Height '5\' 8"'$  (1.727 m)     Head Circumference      Peak Flow      Pain Score 6     Pain Loc      Pain Edu?      Excl. in [place]?     Most recent vital signs: Vitals:   02/02/22 1542 02/02/22 2003  BP: (!) 148/106 (!) 145/102  Pulse: 80 62  Resp: 20 18  Temp: 98.2 F (36.8 C) 98.1 F (36.7 C)  SpO2: 96% 97%    General Awake, no distress.  CV:  Good peripheral perfusion.  RESP:  Normal effort.  ABD:  No distention.  MSK:  Composite fist on the left hand.  Patient with a lateral distal phalanx fracture on the left finger.  No nailbed avulsion or injuries appreciated.   ED Results / Procedures / Treatments   Labs (all labs ordered are listed, but only abnormal results are displayed) Labs Reviewed - No data to display   EKG   RADIOLOGY  I personally viewed and evaluated these images as part of my medical decision making, as well as reviewing the written report by the radiologist.  ED Provider Interpretation: no acute fracture or radiopaque FB noted}  No results found.   PROCEDURES:  Critical Care performed: No  ..Laceration Repair  Date/Time: 02/02/2022 6:38 PM  Performed  by: Melvenia Needles, PA-C Authorized by: Melvenia Needles, PA-C   Consent:    Consent obtained:  Verbal   Risks, benefits, and alternatives were discussed: yes     Risks discussed:  Pain and poor wound healing Universal protocol:    Imaging studies available: yes     Patient identity confirmed:  Verbally with patient Anesthesia:    Anesthesia method:  Nerve block   Block location:  Flexor tendon sheath   Block needle gauge:  27 G   Block anesthetic:  Lidocaine 1% w/o epi   Block injection procedure:  Anatomic landmarks identified, anatomic landmarks palpated and incremental injection   Block outcome:  Anesthesia achieved Laceration details:    Location:  Finger   Finger location:  L index finger   Length (cm):  1   Depth (mm):  5 Pre-procedure details:    Preparation:  Patient was prepped and draped in usual sterile fashion Exploration:    Limited defect created (wound extended): no     Hemostasis achieved with:  Direct pressure   Imaging obtained: x-ray     Imaging outcome: foreign body not noted     Contaminated: no   Treatment:    Area cleansed with:  Povidone-iodine and saline   Irrigation solution:  Sterile saline   Visualized foreign bodies/material removed: no     Debridement:  None   Undermining:  None   Scar revision: no   Skin repair:    Repair method:  Sutures   Suture size:  4-0   Suture material:  Nylon   Suture technique:  Simple interrupted   Number of sutures:  5 Approximation:    Approximation:  Close Repair type:    Repair type:  Simple Post-procedure details:    Dressing:  Non-adherent dressing and splint for protection   Procedure completion:  Tolerated well, no immediate complications    MEDICATIONS ORDERED IN ED: Medications  Tdap (BOOSTRIX) injection 0.5 mL (0.5 mLs Intramuscular Given 02/02/22 1743)     IMPRESSION / MDM / ASSESSMENT AND PLAN / ED COURSE  I reviewed the triage vital signs and the nursing notes.                               Differential diagnosis includes, but is not limited to, finger laceration finger abrasion, nail avulsion, finger amputation  Patient's presentation is most consistent with acute complicated illness / injury requiring diagnostic workup.  Patient's diagnosis is consistent with finger laceration.  Patient consents to suture repair which was performed in the ED with good wound edge approximation.  Patient will be discharged home with wound care supplies. Patient is to follow up with his primary provider for suture removal in 7 to 10 days as needed or otherwise directed. Patient is given ED precautions to return to the ED for any worsening or new symptoms.  FINAL CLINICAL IMPRESSION(S) / ED DIAGNOSES   Final diagnoses:  Laceration of left index finger without foreign body without damage to nail, initial encounter     Rx / DC Orders   ED Discharge Orders     None        Note:  This document was prepared using Dragon voice recognition software and may include unintentional dictation errors.    Melvenia Needles, PA-C 02/05/22 0003    Lucrezia Starch, MD 02/05/22 [phone]

## 2022-02-02 NOTE — ED Notes (Signed)
Pt in bed, sig other at bedside, pt states that he is ready to go home, pt verbalized understanding d/c and follow up, pt ambulatory from dpt

## 2022-02-23 ENCOUNTER — Ambulatory Visit: Payer: BC Managed Care – PPO | Admitting: Dermatology

## 2022-02-23 DIAGNOSIS — L820 Inflamed seborrheic keratosis: Secondary | ICD-10-CM

## 2022-02-23 DIAGNOSIS — L814 Other melanin hyperpigmentation: Secondary | ICD-10-CM

## 2022-02-23 DIAGNOSIS — L578 Other skin changes due to chronic exposure to nonionizing radiation: Secondary | ICD-10-CM | POA: Diagnosis not present

## 2022-02-23 DIAGNOSIS — Z1283 Encounter for screening for malignant neoplasm of skin: Secondary | ICD-10-CM | POA: Diagnosis not present

## 2022-02-23 DIAGNOSIS — L918 Other hypertrophic disorders of the skin: Secondary | ICD-10-CM

## 2022-02-23 NOTE — Progress Notes (Signed)
Follow-Up Visit   Subjective  Christopher Campos is a 51 y.o. male who presents for the following: Spots to check.  The patient presents for Upper Body Skin Exam (UBSE) for skin cancer screening and mole check.  The patient has spots, moles and lesions to be evaluated, some may be new or changing and the patient has concerns that these could be cancer.  Patient here to have spots checked on the left clavicle and right upper inner arm. Spot on the clavicle was inflamed. History of AK of the left nose. No history of skin cancer.   The following portions of the chart were reviewed this encounter and updated as appropriate:       Review of Systems:  No other skin or systemic complaints except as noted in HPI or Assessment and Plan.  Objective  Well appearing patient in no apparent distress; mood and affect are within normal limits.  A focused examination was performed including face, chest. Relevant physical exam findings are noted in the Assessment and Plan.  Left Clavicle x 1, L paranasal x 1, L infraocular x 1 (3) Waxy tan pedunculated papules    Assessment & Plan  Skin cancer screening performed today.  Actinic Damage - chronic, secondary to cumulative UV radiation exposure/sun exposure over time - diffuse scaly erythematous macules with underlying dyspigmentation - Recommend daily broad spectrum sunscreen SPF 30+ to sun-exposed areas, reapply every 2 hours as needed.  - Recommend staying in the shade or wearing long sleeves, sun glasses (UVA+UVB protection) and wide brim hats (4-inch brim around the entire circumference of the hat). - Call for new or changing lesions.  Acrochordons (Skin Tags) - Removal desired by patient - Fleshy, skin-colored pedunculated papule of the right axilla - Benign appearing.  - Patient desires removal. Reviewed that this is not covered by insurance and they will be charged a cosmetic fee for removal. Patient signed non-covered consent.  - Prior to  procedure, discussed risks of blister formation, small wound, skin dyspigmentation, or rare scar following cryotherapy.  PROCEDURE - Cryotherapy was performed to affected areas. The procedure was tolerated well. Wound care was reviewed with the patient. They were advised to call with any concerns. Total number of treated acrochordons 1  Inflamed seborrheic keratosis (3) Left Clavicle x 1, L paranasal x 1, L infraocular x 1  vs Irritated Skin Tag  Reassured benign age-related growth. Symptomatic, irritating, patient would like treated.   Destruction of lesion - Left Clavicle x 1, L paranasal x 1, L infraocular x 1  Destruction method: cryotherapy   Informed consent: discussed and consent obtained   Lesion destroyed using liquid nitrogen: Yes   Region frozen until ice ball extended beyond lesion: Yes   Outcome: patient tolerated procedure well with no complications   Post-procedure details: wound care instructions given   Additional details:  Prior to procedure, discussed risks of blister formation, small wound, skin dyspigmentation, or rare scar following cryotherapy. Recommend Vaseline ointment to treated areas while healing.   Lentigines - Scattered tan macules - Due to sun exposure - Benign-appering, observe - Recommend daily broad spectrum sunscreen SPF 30+ to sun-exposed areas, reapply every 2 hours as needed. - Call for any changes  Return if symptoms worsen or fail to improve.  IJamesetta Orleans, CMA, am acting as scribe for Brendolyn Patty, MD .  Documentation: I have reviewed the above documentation for accuracy and completeness, and I agree with the above.  Brendolyn Patty MD

## 2022-02-23 NOTE — Patient Instructions (Addendum)
Cryotherapy Aftercare  Wash gently with soap and water everyday.   Apply Vaseline and Band-Aid daily until healed.     Due to recent changes in healthcare laws, you may see results of your pathology and/or laboratory studies on MyChart before the doctors have had a chance to review them. We understand that in some cases there may be results that are confusing or concerning to you. Please understand that not all results are received at the same time and often the doctors may need to interpret multiple results in order to provide you with the best plan of care or course of treatment. Therefore, we ask that you please give us 2 business days to thoroughly review all your results before contacting the office for clarification. Should we see a critical lab result, you will be contacted sooner.   If You Need Anything After Your Visit  If you have any questions or concerns for your doctor, please call our main line at [phone] and press option 4 to reach your doctor's medical assistant. If no one answers, please leave a voicemail as directed and we will return your call as soon as possible. Messages left after 4 pm will be answered the following business day.   You may also send us a message via MyChart. We typically respond to MyChart messages within 1-2 business days.  For prescription refills, please ask your pharmacy to contact our office. Our fax number is [phone].  If you have an urgent issue when the clinic is closed that cannot wait until the next business day, you can page your doctor at the number below.    Please note that while we do our best to be available for urgent issues outside of office hours, we are not available 24/7.   If you have an urgent issue and are unable to reach us, you may choose to seek medical care at your doctor's office, retail clinic, urgent care center, or emergency room.  If you have a medical emergency, please immediately call 911 or go to the  emergency department.  Pager Numbers  - Dr. Kowalski: [phone]  - Dr. Moye: [phone]  - Dr. Stewart: [phone]  In the event of inclement weather, please call our main line at [phone] for an update on the status of any delays or closures.  Dermatology Medication Tips: Please keep the boxes that topical medications come in in order to help keep track of the instructions about where and how to use these. Pharmacies typically print the medication instructions only on the boxes and not directly on the medication tubes.   If your medication is too expensive, please contact our office at [phone] option 4 or send us a message through MyChart.   We are unable to tell what your co-pay for medications will be in advance as this is different depending on your insurance coverage. However, we may be able to find a substitute medication at lower cost or fill out paperwork to get insurance to cover a needed medication.   If a prior authorization is required to get your medication covered by your insurance company, please allow us 1-2 business days to complete this process.  Drug prices often vary depending on where the prescription is filled and some pharmacies may offer cheaper prices.  The website www.goodrx.com contains coupons for medications through different pharmacies. The prices here do not account for what the cost may be with help from insurance (it may be cheaper with your insurance), but the website can   give you the price if you did not use any insurance.  - You can print the associated coupon and take it with your prescription to the pharmacy.  - You may also stop by our office during regular business hours and pick up a GoodRx coupon card.  - If you need your prescription sent electronically to a different pharmacy, notify our office through [facility] MyChart or by phone at [phone] option 4.     Si Usted Necesita Algo Despus de Su Visita  Tambin puede  enviarnos un mensaje a travs de MyChart. Por lo general respondemos a los mensajes de MyChart en el transcurso de 1 a 2 das hbiles.  Para renovar recetas, por favor pida a su farmacia que se ponga en contacto con nuestra oficina. Nuestro nmero de fax es el [phone].  Si tiene un asunto urgente cuando la clnica est cerrada y que no puede esperar hasta el siguiente da hbil, puede llamar/localizar a su doctor(a) al nmero que aparece a continuacin.   Por favor, tenga en cuenta que aunque hacemos todo lo posible para estar disponibles para asuntos urgentes fuera del horario de oficina, no estamos disponibles las 24 horas del da, los 7 das de la semana.   Si tiene un problema urgente y no puede comunicarse con nosotros, puede optar por buscar atencin mdica  en el consultorio de su doctor(a), en una clnica privada, en un centro de atencin urgente o en una sala de emergencias.  Si tiene una emergencia mdica, por favor llame inmediatamente al 911 o vaya a la sala de emergencias.  Nmeros de bper  - Dr. Kowalski: [phone]  - Dra. Moye: [phone]  - Dra. Stewart: [phone]  En caso de inclemencias del tiempo, por favor llame a nuestra lnea principal al [phone] para una actualizacin sobre el estado de cualquier retraso o cierre.  Consejos para la medicacin en dermatologa: Por favor, guarde las cajas en las que vienen los medicamentos de uso tpico para ayudarle a seguir las instrucciones sobre dnde y cmo usarlos. Las farmacias generalmente imprimen las instrucciones del medicamento slo en las cajas y no directamente en los tubos del medicamento.   Si su medicamento es muy caro, por favor, pngase en contacto con nuestra oficina llamando al [phone] y presione la opcin 4 o envenos un mensaje a travs de MyChart.   No podemos decirle cul ser su copago por los medicamentos por adelantado ya que esto es diferente dependiendo de la cobertura de su seguro.  Sin embargo, es posible que podamos encontrar un medicamento sustituto a menor costo o llenar un formulario para que el seguro cubra el medicamento que se considera necesario.   Si se requiere una autorizacin previa para que su compaa de seguros cubra su medicamento, por favor permtanos de 1 a 2 das hbiles para completar este proceso.  Los precios de los medicamentos varan con frecuencia dependiendo del lugar de dnde se surte la receta y alguna farmacias pueden ofrecer precios ms baratos.  El sitio web www.goodrx.com tiene cupones para medicamentos de diferentes farmacias. Los precios aqu no tienen en cuenta lo que podra costar con la ayuda del seguro (puede ser ms barato con su seguro), pero el sitio web puede darle el precio si no utiliz ningn seguro.  - Puede imprimir el cupn correspondiente y llevarlo con su receta a la farmacia.  - Tambin puede pasar por nuestra oficina durante el horario de atencin regular y recoger una tarjeta de cupones de GoodRx.  -   Si necesita que su receta se enve electrnicamente a una farmacia diferente, informe a nuestra oficina a travs de MyChart de [place] o por telfono llamando al [phone] y presione la opcin 4.  

## 2022-07-27 ENCOUNTER — Ambulatory Visit: Payer: BC Managed Care – PPO | Admitting: Dermatology

## 2022-08-01 HISTORY — PX: COLONOSCOPY W/ POLYPECTOMY: SHX1380

## 2022-08-06 ENCOUNTER — Encounter: Payer: Self-pay | Admitting: Emergency Medicine

## 2022-08-06 ENCOUNTER — Emergency Department: Payer: BC Managed Care – PPO

## 2022-08-06 ENCOUNTER — Emergency Department
Admission: EM | Admit: 2022-08-06 | Discharge: 2022-08-06 | Disposition: A | Discharge status: Home / Self Care | Payer: BC Managed Care – PPO | Attending: Student in an Organized Health Care Education/Training Program | Admitting: Student in an Organized Health Care Education/Training Program

## 2022-08-06 ENCOUNTER — Other Ambulatory Visit: Payer: Self-pay

## 2022-08-06 DIAGNOSIS — R112 Nausea with vomiting, unspecified: Secondary | ICD-10-CM | POA: Diagnosis present

## 2022-08-06 DIAGNOSIS — R5381 Other malaise: Secondary | ICD-10-CM | POA: Diagnosis not present

## 2022-08-06 DIAGNOSIS — R1084 Generalized abdominal pain: Secondary | ICD-10-CM | POA: Diagnosis not present

## 2022-08-06 DIAGNOSIS — R4182 Altered mental status, unspecified: Secondary | ICD-10-CM | POA: Insufficient documentation

## 2022-08-06 LAB — URINALYSIS, ROUTINE W REFLEX MICROSCOPIC
Bilirubin Urine: NEGATIVE
Glucose, UA: NEGATIVE mg/dL
Hgb urine dipstick: NEGATIVE
Ketones, ur: 80 mg/dL — AB
Leukocytes,Ua: NEGATIVE
Nitrite: NEGATIVE
Protein, ur: NEGATIVE mg/dL
Specific Gravity, Urine: 1.017 (ref 1.005–1.030)
pH: 7 (ref 5.0–8.0)

## 2022-08-06 LAB — CBC
HCT: 49.5 % (ref 39.0–52.0)
Hemoglobin: 16.7 g/dL (ref 13.0–17.0)
MCH: 30 pg (ref 26.0–34.0)
MCHC: 33.7 g/dL (ref 30.0–36.0)
MCV: 89 fL (ref 80.0–100.0)
Platelets: 365 10*3/uL (ref 150–400)
RBC: 5.56 MIL/uL (ref 4.22–5.81)
RDW: 11.9 % (ref 11.5–15.5)
WBC: 12.7 10*3/uL — ABNORMAL HIGH (ref 4.0–10.5)
nRBC: 0 % (ref 0.0–0.2)

## 2022-08-06 LAB — COMPREHENSIVE METABOLIC PANEL
ALT: 55 U/L — ABNORMAL HIGH (ref 0–44)
AST: 22 U/L (ref 15–41)
Albumin: 4.3 g/dL (ref 3.5–5.0)
Alkaline Phosphatase: 64 U/L (ref 38–126)
Anion gap: 11 (ref 5–15)
BUN: 14 mg/dL (ref 6–20)
CO2: 22 mmol/L (ref 22–32)
Calcium: 9 mg/dL (ref 8.9–10.3)
Chloride: 102 mmol/L (ref 98–111)
Creatinine, Ser: 0.91 mg/dL (ref 0.61–1.24)
GFR, Estimated: >60 mL/min (ref >60)
Glucose, Bld: 121 mg/dL — ABNORMAL HIGH (ref 70–99)
Potassium: 3.9 mmol/L (ref 3.5–5.1)
Sodium: 135 mmol/L (ref 135–145)
Total Bilirubin: 1.4 mg/dL — ABNORMAL HIGH (ref 0.3–1.2)
Total Protein: 7.4 g/dL (ref 6.5–8.1)

## 2022-08-06 LAB — TROPONIN I (HIGH SENSITIVITY)
Troponin I (High Sensitivity): 3 ng/L (ref <18)
Troponin I (High Sensitivity): 4 ng/L (ref <18)

## 2022-08-06 LAB — LIPASE, BLOOD: Lipase: 37 U/L (ref 11–51)

## 2022-08-06 MED ORDER — ALUM & MAG HYDROXIDE-SIMETH 200-200-20 MG/5ML PO SUSP
30.0000 mL | Freq: Once | ORAL | Status: AC
  Administered 2022-08-06: 30 mL via ORAL
  Filled 2022-08-06: qty 30

## 2022-08-06 MED ORDER — SUCRALFATE 1 G PO TABS: 1.0000 g | ORAL_TABLET | Freq: Three times a day (TID) | ORAL | 0 refills | Status: AC | PRN

## 2022-08-06 MED ORDER — IOHEXOL 300 MG/ML  SOLN
100.0000 mL | Freq: Once | INTRAMUSCULAR | Status: AC | PRN
  Administered 2022-08-06: 100 mL via INTRAVENOUS

## 2022-08-06 MED ORDER — PANTOPRAZOLE SODIUM 40 MG IV SOLR
40.0000 mg | Freq: Once | INTRAVENOUS | Status: AC
  Administered 2022-08-06: 40 mg via INTRAVENOUS
  Filled 2022-08-06: qty 10

## 2022-08-06 MED ORDER — PANTOPRAZOLE SODIUM 40 MG PO TBEC: 40.0000 mg | DELAYED_RELEASE_TABLET | Freq: Every day | ORAL | 1 refills | Status: AC

## 2022-08-06 MED ORDER — MECLIZINE HCL 25 MG PO TABS: 25.0000 mg | ORAL_TABLET | Freq: Three times a day (TID) | ORAL | 0 refills | Status: AC | PRN

## 2022-08-06 MED ORDER — METOCLOPRAMIDE HCL 10 MG PO TABS: 10.0000 mg | ORAL_TABLET | Freq: Four times a day (QID) | ORAL | 0 refills | Status: AC | PRN

## 2022-08-06 MED ORDER — DEXTROSE 5 % IN LACTATED RINGERS IV BOLUS
1000.0000 mL | Freq: Once | INTRAVENOUS | Status: AC
  Administered 2022-08-06: 1000 mL via INTRAVENOUS
  Filled 2022-08-06: qty 1000

## 2022-08-06 MED ORDER — SODIUM CHLORIDE 0.9 % IV BOLUS
1000.0000 mL | Freq: Once | INTRAVENOUS | Status: AC
  Administered 2022-08-06: 1000 mL via INTRAVENOUS

## 2022-08-06 MED ORDER — ONDANSETRON HCL 4 MG/2ML IJ SOLN
4.0000 mg | Freq: Once | INTRAMUSCULAR | Status: AC
  Administered 2022-08-06: 4 mg via INTRAVENOUS
  Filled 2022-08-06: qty 2

## 2022-08-06 MED ORDER — SUCRALFATE 1 G PO TABS
1.0000 g | ORAL_TABLET | Freq: Once | ORAL | Status: AC
  Administered 2022-08-06: 1 g via ORAL
  Filled 2022-08-06: qty 1

## 2022-08-06 MED ORDER — MORPHINE SULFATE (PF) 4 MG/ML IV SOLN
4.0000 mg | INTRAVENOUS | Status: DC | PRN
  Filled 2022-08-06: qty 1

## 2022-08-06 NOTE — ED Provider Notes (Signed)
[facility] Provider Note    Event Date/Time   First MD Initiated Contact with Patient 08/06/22 1242     (approximate)   History   Emesis   HPI  Christopher Campos is a 52 y.o. male with history of bronchitis recently diagnosed with flu presents to the ER for several weeks of nausea vomiting abdominal pain generalized malaise.  Has received 2 rounds of prednisone as well as Tamiflu and has received 2 rounds of antibiotics.  He is not having any diarrhea.  Having trouble keeping anything down.  Does have some generalized abdominal pain.  Is not passing gas.  States he is vomiting multiple times throughout the day.  Went to PCPs office yesterday received 2 bags of fluids as well as antiemetic and is not feeling any better.     Physical Exam   Triage Vital Signs: ED Triage Vitals  Enc Vitals Group     BP 08/06/22 1209 (!) 130/106     Pulse Rate 08/06/22 1209 81     Resp 08/06/22 1209 18     Temp 08/06/22 1209 98.6 F (37 C)     Temp Source 08/06/22 1209 Oral     SpO2 08/06/22 1209 97 %     Weight 08/06/22 1210 205 lb (93 kg)     Height 08/06/22 1210 '5\' 8"'$  (1.727 m)     Head Circumference --      Peak Flow --      Pain Score 08/06/22 1210 5     Pain Loc --      Pain Edu? --      Excl. in [place]? --     Most recent vital signs: Vitals:   08/06/22 1209 08/06/22 1534  BP: (!) 130/106 122/80  Pulse: 81 78  Resp: 18 17  Temp: 98.6 F (37 C) 98 F (36.7 C)  SpO2: 97% 95%     Constitutional: Alert  Eyes: Conjunctivae are normal.  Head: Atraumatic. Nose: No congestion/rhinnorhea. Mouth/Throat: Mucous membranes are moist.   Neck: Painless ROM.  Cardiovascular:   Good peripheral circulation. Respiratory: Normal respiratory effort.  No retractions.  Gastrointestinal: Soft with mild ttp, no guarding or rebound Musculoskeletal:  no deformity Neurologic:  MAE spontaneously. No gross focal neurologic deficits are appreciated.  Skin:  Skin is warm, dry  and intact. No rash noted. Psychiatric: Mood and affect are normal. Speech and behavior are normal.    ED Results / Procedures / Treatments   Labs (all labs ordered are listed, but only abnormal results are displayed) Labs Reviewed  COMPREHENSIVE METABOLIC PANEL - Abnormal; Notable for the following components:      Result Value   Glucose, Bld 121 (*)    ALT 55 (*)    Total Bilirubin 1.4 (*)    All other components within normal limits  CBC - Abnormal; Notable for the following components:   WBC 12.7 (*)    All other components within normal limits  URINALYSIS, ROUTINE W REFLEX MICROSCOPIC - Abnormal; Notable for the following components:   Color, Urine YELLOW (*)    APPearance HAZY (*)    Ketones, ur 80 (*)    All other components within normal limits  LIPASE, BLOOD  TROPONIN I (HIGH SENSITIVITY)  TROPONIN I (HIGH SENSITIVITY)     EKG      RADIOLOGY Please see ED Course for my review and interpretation.  I personally reviewed all radiographic images ordered to evaluate for the above acute complaints  and reviewed radiology reports and findings.  These findings were personally discussed with the patient.  Please see medical record for radiology report.    PROCEDURES:  Critical Care performed: No  Procedures   MEDICATIONS ORDERED IN ED: Medications  morphine (PF) 4 MG/ML injection 4 mg (has no administration in time range)  ondansetron (ZOFRAN) injection 4 mg (4 mg Intravenous Given 08/06/22 1311)  sodium chloride 0.9 % bolus 1,000 mL (0 mLs Intravenous Stopped 08/06/22 1531)  iohexol (OMNIPAQUE) 300 MG/ML solution 100 mL (100 mLs Intravenous Contrast Given 08/06/22 1423)  dextrose 5% lactated ringers bolus 1,000 mL (0 mLs Intravenous Stopped 08/06/22 1739)  pantoprazole (PROTONIX) injection 40 mg (40 mg Intravenous Given 08/06/22 1739)  alum & mag hydroxide-simeth (MAALOX/MYLANTA) 200-200-20 MG/5ML suspension 30 mL (30 mLs Oral Given 08/06/22 1739)  sucralfate (CARAFATE)  tablet 1 g (1 g Oral Given 08/06/22 1822)     IMPRESSION / MDM / ASSESSMENT AND PLAN / ED COURSE  I reviewed the triage vital signs and the nursing notes.                              Differential diagnosis includes, but is not limited to, enteritis, gastritis, colitis, viral syndrome, dehydration, electrolyte abnormality, SBO, perforation, pna  Patient presenting to the ER for evaluation of symptoms as described above.  Based on symptoms, risk factors and considered above differential, this presenting complaint could reflect a potentially life-threatening illness therefore the patient will be placed on continuous pulse oximetry and telemetry for monitoring.  Laboratory evaluation will be sent to evaluate for the above complaints.      Clinical Course as of 08/06/22 1900  Sat Aug 06, 2022  1322 X-ray my review and interpretation does not show any evidence of infiltrate or consolidation. [PR]  1340 Ambulating with steady gait. [PR]  7353 CTA my review and interpretation does not show any evidence of perforation or obstruction. [PR]  1730 CT ABDOMEN PELVIS W CONTRAST [PR]  2992 Patient's imaging workup here has been reassuring.  Vital signs are stable.  He has not had any persistent vomiting.  Do suspect some gastritis.  May have viral related or recent medication effect.  He is tolerating p.o.  Improved with PPI and GI cocktail.  Will be given prescription for Protonix, Carafate.  Has been reporting some dizziness.  CT imaging exam reassuring.  Patient does appear stable and appropriate for outpatient follow-up. [PR]    Clinical Course User Index [PR] Merlyn Lot, MD      FINAL CLINICAL IMPRESSION(S) / ED DIAGNOSES   Final diagnoses:  Nausea and vomiting, unspecified vomiting type     Rx / DC Orders   ED Discharge Orders          Ordered    pantoprazole (PROTONIX) 40 MG tablet  Daily        08/06/22 1814    metoCLOPramide (REGLAN) 10 MG tablet  Every 6 hours PRN         08/06/22 1816    meclizine (ANTIVERT) 25 MG tablet  3 times daily PRN        08/06/22 1816    sucralfate (CARAFATE) 1 g tablet  3 times daily PRN        08/06/22 1816             Note:  This document was prepared using Dragon voice recognition software and may include unintentional dictation errors.  Merlyn Lot, MD 08/06/22 1900

## 2022-08-06 NOTE — ED Triage Notes (Signed)
Pt to ER from [facility] with c/o continued n/v since having the flu 2 weeks ago.  Pt c/o dizziness and generally feeling unwell.  Pt has been treated with steroids and abx.  States had IV fluids yesterday and continues to have consistent vomiting and dizziness.  Sent for further evaluation.

## 2022-08-06 NOTE — Discharge Instructions (Addendum)

## 2022-08-10 ENCOUNTER — Ambulatory Visit: Payer: BC Managed Care – PPO | Admitting: Dermatology

## 2022-08-24 ENCOUNTER — Encounter: Payer: Self-pay | Admitting: Dermatology

## 2022-08-24 ENCOUNTER — Ambulatory Visit: Payer: BC Managed Care – PPO | Admitting: Dermatology

## 2022-08-24 VITALS — BP 112/65 | HR 74

## 2022-08-24 DIAGNOSIS — L578 Other skin changes due to chronic exposure to nonionizing radiation: Secondary | ICD-10-CM

## 2022-08-24 DIAGNOSIS — L820 Inflamed seborrheic keratosis: Secondary | ICD-10-CM | POA: Diagnosis not present

## 2022-08-24 DIAGNOSIS — L814 Other melanin hyperpigmentation: Secondary | ICD-10-CM

## 2022-08-24 NOTE — Patient Instructions (Addendum)
Cryotherapy Aftercare  Wash gently with soap and water everyday.   Apply Vaseline and Band-Aid daily until healed.   Recommend daily broad spectrum sunscreen SPF 30+ to sun-exposed areas, reapply every 2 hours as needed. Call for new or changing lesions.  Staying in the shade or wearing long sleeves, sun glasses (UVA+UVB protection) and wide brim hats (4-inch brim around the entire circumference of the hat) are also recommended for sun protection.    Seborrheic Keratosis  What causes seborrheic keratoses? Seborrheic keratoses are harmless, common skin growths that first appear during adult life.  As time goes by, more growths appear.  Some people may develop a large number of them.  Seborrheic keratoses appear on both covered and uncovered body parts.  They are not caused by sunlight.  The tendency to develop seborrheic keratoses can be inherited.  They vary in color from skin-colored to gray, brown, or even black.  They can be either smooth or have a rough, warty surface.   Seborrheic keratoses are superficial and look as if they were stuck on the skin.  Under the microscope this type of keratosis looks like layers upon layers of skin.  That is why at times the top layer may seem to fall off, but the rest of the growth remains and re-grows.    Treatment Seborrheic keratoses do not need to be treated, but can easily be removed in the office.  Seborrheic keratoses often cause symptoms when they rub on clothing or jewelry.  Lesions can be in the way of shaving.  If they become inflamed, they can cause itching, soreness, or burning.  Removal of a seborrheic keratosis can be accomplished by freezing, burning, or surgery. If any spot bleeds, scabs, or grows rapidly, please return to have it checked, as these can be an indication of a skin cancer.   Due to recent changes in healthcare laws, you may see results of your pathology and/or laboratory studies on MyChart before the doctors have had a chance  to review them. We understand that in some cases there may be results that are confusing or concerning to you. Please understand that not all results are received at the same time and often the doctors may need to interpret multiple results in order to provide you with the best plan of care or course of treatment. Therefore, we ask that you please give [place] 2 business days to thoroughly review all your results before contacting the office for clarification. Should we see a critical lab result, you will be contacted sooner.   If You Need Anything After Your Visit  If you have any questions or concerns for your doctor, please call our main line at [phone] and press option 4 to reach your doctor's medical assistant. If no one answers, please leave a voicemail as directed and we will return your call as soon as possible. Messages left after 4 pm will be answered the following business day.   You may also send [place] a message via [place]. We typically respond to MyChart messages within 1-2 business days.  For prescription refills, please ask your pharmacy to contact our office. Our fax number is [phone].  If you have an urgent issue when the clinic is closed that cannot wait until the next business day, you can page your doctor at the number below.    Please note that while we do our best to be available for urgent issues outside of office hours, we are not available 24/7.  office hours, we are not available 24/7.   If you have an urgent issue and are unable to reach us, you may choose to seek medical care at your doctor's office, retail clinic, urgent care center, or emergency room.  If you have a medical emergency, please immediately call 911 or go to the emergency department.  Pager Numbers  - Dr. Kowalski: [phone]  - Dr. Moye: [phone]  - Dr. Stewart: [phone]  In the event of inclement weather, please call our main line at [phone] for an update on the status of any delays or  closures.  Dermatology Medication Tips: Please keep the boxes that topical medications come in in order to help keep track of the instructions about where and how to use these. Pharmacies typically print the medication instructions only on the boxes and not directly on the medication tubes.   If your medication is too expensive, please contact our office at [phone] option 4 or send us a message through MyChart.   We are unable to tell what your co-pay for medications will be in advance as this is different depending on your insurance coverage. However, we may be able to find a substitute medication at lower cost or fill out paperwork to get insurance to cover a needed medication.   If a prior authorization is required to get your medication covered by your insurance company, please allow us 1-2 business days to complete this process.  Drug prices often vary depending on where the prescription is filled and some pharmacies may offer cheaper prices.  The website www.goodrx.com contains coupons for medications through different pharmacies. The prices here do not account for what the cost may be with help from insurance (it may be cheaper with your insurance), but the website can give you the price if you did not use any insurance.  - You can print the associated coupon and take it with your prescription to the pharmacy.  - You may also stop by our office during regular business hours and pick up a GoodRx coupon card.  - If you need your prescription sent electronically to a different pharmacy, notify our office through [facility] MyChart or by phone at [phone] option 4.     Si Usted Necesita Algo Despus de Su Visita  Tambin puede enviarnos un mensaje a travs de MyChart. Por lo general respondemos a los mensajes de MyChart en el transcurso de 1 a 2 das hbiles.  Para renovar recetas, por favor pida a su farmacia que se ponga en contacto con nuestra oficina. Nuestro nmero de fax  es el [phone].  Si tiene un asunto urgente cuando la clnica est cerrada y que no puede esperar hasta el siguiente da hbil, puede llamar/localizar a su doctor(a) al nmero que aparece a continuacin.   Por favor, tenga en cuenta que aunque hacemos todo lo posible para estar disponibles para asuntos urgentes fuera del horario de oficina, no estamos disponibles las 24 horas del da, los 7 das de la semana.   Si tiene un problema urgente y no puede comunicarse con nosotros, puede optar por buscar atencin mdica  en el consultorio de su doctor(a), en una clnica privada, en un centro de atencin urgente o en una sala de emergencias.  Si tiene una emergencia mdica, por favor llame inmediatamente al 911 o vaya a la sala de emergencias.  Nmeros de bper  - Dr. Kowalski: [phone]  - Dra. Moye: [phone]  - Dra. Stewart: [phone]  En caso de inclemencias   al [phone] para una actualizacin sobre el [place] de cualquier retraso o cierre.  Consejos para la medicacin en dermatologa: Por favor, guarde las cajas en las que vienen los medicamentos de uso tpico para ayudarle a seguir las instrucciones sobre dnde y cmo usarlos. Las farmacias generalmente imprimen las instrucciones del medicamento slo en las cajas y no directamente en los tubos del [place].   Si su medicamento es muy caro, por favor, pngase en contacto con Zigmund Daniel llamando al [phone] y presione la opcin 4 o envenos un mensaje a travs de Pharmacist, community.   No podemos decirle cul ser su copago por los medicamentos por adelantado ya que esto es diferente dependiendo de la cobertura de su seguro. Sin embargo, es posible que podamos encontrar un medicamento sustituto a Electrical engineer un formulario para que el seguro cubra el medicamento que se considera necesario.   Si se requiere una autorizacin previa para que su compaa de seguros [place] su medicamento, por favor  permtanos de 1 a 2 das hbiles para completar este proceso.  Los precios de los medicamentos varan con frecuencia dependiendo del Environmental consultant de dnde se surte la receta y alguna farmacias pueden ofrecer precios ms baratos.  El sitio web www.goodrx.com tiene cupones para medicamentos de Airline pilot. Los precios aqu no tienen en cuenta lo que podra costar con la ayuda del seguro (puede ser ms barato con su seguro), pero el sitio web puede darle el precio si no utiliz Research scientist (physical sciences).  - Puede imprimir el cupn correspondiente y llevarlo con su receta a la farmacia.  - Tambin puede pasar por nuestra oficina durante el horario de atencin regular y Charity fundraiser una tarjeta de cupones de GoodRx.  - Si necesita que su receta se enve electrnicamente a una farmacia diferente, informe a nuestra oficina a travs de MyChart de [place] o por telfono llamando al [phone] y presione la opcin 4.

## 2022-08-24 NOTE — Progress Notes (Signed)
   Follow-Up Visit   Subjective  Christopher Campos is a 52 y.o. male who presents for the following: Skin Problem (Raised spot on left side of face. Dur: 6 months. Growing. No personal Hx of skin cancer. Hx of [place] in the past).  The patient has spots, moles and lesions to be evaluated, some may be new or changing and the patient has concerns that these could be cancer.   The following portions of the chart were reviewed this encounter and updated as appropriate:      Review of Systems: No other skin or systemic complaints except as noted in HPI or Assessment and Plan.   Objective  Well appearing patient in no apparent distress; mood and affect are within normal limits.  A focused examination was performed including face. Relevant physical exam findings are noted in the Assessment and Plan.  left preauricular x1 69m waxy flesh papule   Assessment & Plan  Inflamed seborrheic keratosis left preauricular x1  Symptomatic, irritating, patient would like treated.  Destruction of lesion - left preauricular x1  Destruction method: cryotherapy   Informed consent: discussed and consent obtained   Lesion destroyed using liquid nitrogen: Yes   Region frozen until ice ball extended beyond lesion: Yes   Outcome: patient tolerated procedure well with no complications   Post-procedure details: wound care instructions given   Additional details:  Prior to procedure, discussed risks of blister formation, small wound, skin dyspigmentation, or rare scar following cryotherapy. Recommend Vaseline ointment to treated areas while healing.    Lentigines - Scattered tan macules - Due to sun exposure - Benign-appearing, observe - Recommend daily broad spectrum sunscreen SPF 30+ to sun-exposed areas, reapply every 2 hours as needed. - Call for any changes  Actinic Damage - chronic, secondary to cumulative UV radiation exposure/sun exposure over time - diffuse scaly erythematous macules with  underlying dyspigmentation - Recommend daily broad spectrum sunscreen SPF 30+ to sun-exposed areas, reapply every 2 hours as needed.  - Recommend staying in the shade or wearing long sleeves, sun glasses (UVA+UVB protection) and wide brim hats (4-inch brim around the entire circumference of the hat). - Call for new or changing lesions.   Return if symptoms worsen or fail to improve.  I, JEmelia Salisbury CMA, am acting as scribe for TBrendolyn Patty MD.  Documentation: I have reviewed the above documentation for accuracy and completeness, and I agree with the above.  TBrendolyn PattyMD

## 2022-09-08 DIAGNOSIS — D123 Benign neoplasm of transverse colon: Secondary | ICD-10-CM

## 2022-09-08 DIAGNOSIS — Z1211 Encounter for screening for malignant neoplasm of colon: Secondary | ICD-10-CM

## 2022-09-08 DIAGNOSIS — D125 Benign neoplasm of sigmoid colon: Secondary | ICD-10-CM

## 2022-09-08 DIAGNOSIS — D124 Benign neoplasm of descending colon: Secondary | ICD-10-CM

## 2022-09-08 DIAGNOSIS — D374 Neoplasm of uncertain behavior of colon: Secondary | ICD-10-CM

## 2023-05-31 DIAGNOSIS — R030 Elevated blood-pressure reading, without diagnosis of hypertension: Secondary | ICD-10-CM | POA: Insufficient documentation

## 2023-05-31 DIAGNOSIS — R7303 Prediabetes: Secondary | ICD-10-CM | POA: Insufficient documentation

## 2023-06-07 ENCOUNTER — Other Ambulatory Visit: Payer: Self-pay | Admitting: Internal Medicine

## 2023-06-07 DIAGNOSIS — G8929 Other chronic pain: Secondary | ICD-10-CM

## 2023-06-23 ENCOUNTER — Ambulatory Visit
Admission: RE | Admit: 2023-06-23 | Discharge: 2023-06-23 | Disposition: A | Discharge status: Home / Self Care | Payer: BC Managed Care – PPO | Source: Ambulatory Visit | Attending: Internal Medicine | Admitting: Internal Medicine

## 2023-06-23 DIAGNOSIS — G8929 Other chronic pain: Secondary | ICD-10-CM

## 2023-08-02 DIAGNOSIS — G8929 Other chronic pain: Secondary | ICD-10-CM

## 2023-08-02 DIAGNOSIS — M7541 Impingement syndrome of right shoulder: Secondary | ICD-10-CM

## 2023-08-02 DIAGNOSIS — M1611 Unilateral primary osteoarthritis, right hip: Secondary | ICD-10-CM

## 2023-08-02 HISTORY — DX: Other chronic pain: G89.29

## 2023-08-02 HISTORY — PX: COLONOSCOPY WITH ESOPHAGOGASTRODUODENOSCOPY (EGD): SHX5779

## 2023-08-02 HISTORY — DX: Impingement syndrome of right shoulder: M75.41

## 2023-08-02 HISTORY — DX: Unilateral primary osteoarthritis, right hip: M16.11

## 2023-08-10 ENCOUNTER — Ambulatory Visit: Payer: BC Managed Care – PPO

## 2023-08-10 DIAGNOSIS — K5289 Other specified noninfective gastroenteritis and colitis: Secondary | ICD-10-CM | POA: Diagnosis not present

## 2023-08-10 DIAGNOSIS — Z9889 Other specified postprocedural states: Secondary | ICD-10-CM | POA: Diagnosis not present

## 2023-08-10 DIAGNOSIS — R140 Abdominal distension (gaseous): Secondary | ICD-10-CM | POA: Diagnosis not present

## 2023-08-10 DIAGNOSIS — K640 First degree hemorrhoids: Secondary | ICD-10-CM | POA: Diagnosis not present

## 2023-08-10 DIAGNOSIS — D125 Benign neoplasm of sigmoid colon: Secondary | ICD-10-CM | POA: Diagnosis present

## 2023-10-04 ENCOUNTER — Ambulatory Visit: Payer: BC Managed Care – PPO | Admitting: Dermatology

## 2023-10-10 ENCOUNTER — Other Ambulatory Visit: Payer: Self-pay | Admitting: Orthopedic Surgery

## 2023-10-11 ENCOUNTER — Encounter: Payer: Self-pay | Admitting: Dermatology

## 2023-10-11 ENCOUNTER — Ambulatory Visit: Payer: BC Managed Care – PPO | Admitting: Dermatology

## 2023-10-11 DIAGNOSIS — W908XXA Exposure to other nonionizing radiation, initial encounter: Secondary | ICD-10-CM | POA: Diagnosis not present

## 2023-10-11 DIAGNOSIS — L720 Epidermal cyst: Secondary | ICD-10-CM | POA: Diagnosis not present

## 2023-10-11 DIAGNOSIS — L729 Follicular cyst of the skin and subcutaneous tissue, unspecified: Secondary | ICD-10-CM

## 2023-10-11 DIAGNOSIS — Z7189 Other specified counseling: Secondary | ICD-10-CM

## 2023-10-11 DIAGNOSIS — L578 Other skin changes due to chronic exposure to nonionizing radiation: Secondary | ICD-10-CM | POA: Diagnosis not present

## 2023-10-11 NOTE — Progress Notes (Signed)
   Follow-Up Visit   Subjective  Christopher Campos is a 53 y.o. male who presents for the following: Pt c/o knot on the shoulder x 6 mths, not painful, but growing larger.   The patient has spots, moles and lesions to be evaluated, some may be new or changing and the patient may have concern these could be cancer.  The following portions of the chart were reviewed this encounter and updated as appropriate: medications, allergies, medical history  Review of Systems:  No other skin or systemic complaints except as noted in HPI or Assessment and Plan.  Objective  Well appearing patient in no apparent distress; mood and affect are within normal limits.  A focused examination was performed of the following areas:  Relevant exam findings are noted in the Assessment and Plan.    Assessment & Plan   EPIDERMAL INCLUSION CYST    Exam: Subcutaneous nodule at L sup deltoid 1.5 cm  Benign-appearing. Exam most consistent with an epidermal inclusion cyst. Discussed that a cyst is a benign growth that can grow over time and sometimes get irritated or inflamed. Recommend observation if it is not bothersome. Discussed option of surgical excision to remove it if it is growing, symptomatic, or other changes noted. 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  - Recommend staying in the shade or wearing long sleeves, sun glasses (UVA+UVB protection) and wide brim hats (4-inch brim around the entire circumference of the hat). - Call for new or changing lesions.  Return for surgery - cyst excision L sup deltoid with Dr. Roseanne Reno or Dr. Katrinka Blazing.  Maylene Roes, CMA, am acting  as scribe for Armida Sans, MD .  Documentation: I have reviewed the above documentation for accuracy and completeness, and I agree with the above.  Armida Sans, MD

## 2023-10-11 NOTE — Patient Instructions (Signed)

## 2023-10-27 ENCOUNTER — Inpatient Hospital Stay
Admission: RE | Admit: 2023-10-27 | Discharge: 2023-10-27 | Disposition: A | Discharge status: Home / Self Care | Source: Ambulatory Visit

## 2023-10-27 DIAGNOSIS — Z01812 Encounter for preprocedural laboratory examination: Secondary | ICD-10-CM

## 2023-10-27 HISTORY — DX: Obesity, unspecified: E66.9

## 2023-10-27 HISTORY — DX: Prediabetes: R73.03

## 2023-10-27 HISTORY — DX: Benign neoplasm of colon, unspecified: D12.6

## 2023-10-27 HISTORY — DX: Calculus of kidney: N20.0

## 2023-10-27 HISTORY — DX: Unspecified asthma, uncomplicated: J45.909

## 2023-10-27 HISTORY — DX: Essential (primary) hypertension: I10

## 2023-10-27 NOTE — Patient Instructions (Signed)
 Your procedure is scheduled on: 11/09/23 - Thursday Report to the Registration Desk on the 1st floor of the [facility]. To find out your arrival time, please call [phone] between 1PM - 3PM on: 11/08/23 - Wednesday If your arrival time is 6:00 am, do not arrive before that time as the [facility] entrance doors do not open until 6:00 am.  REMEMBER: Instructions that are not followed completely may result in serious medical risk, up to and including death; or upon the discretion of your surgeon and anesthesiologist your surgery may need to be rescheduled.  Do not eat food after midnight the night before surgery.  No gum chewing or hard candies.  You may however, drink CLEAR liquids up to 2 hours before you are scheduled to arrive for your surgery. Do not drink anything within 2 hours of your scheduled arrival time.  Clear liquids include: - water  - apple juice without pulp - gatorade (not RED colors) - black coffee or tea (Do NOT add milk or creamers to the coffee or tea) Do NOT drink anything that is not on this list.  In addition, your doctor has ordered for you to drink the provided:  Ensure Pre-Surgery Clear Carbohydrate Drink  Drinking this carbohydrate drink up to two hours before surgery helps to reduce insulin resistance and improve patient outcomes. Please complete drinking 2 hours before scheduled arrival time.  One week prior to surgery: Stop Anti-inflammatories (NSAIDS) such as Advil, Aleve, Ibuprofen, Motrin, Naproxen, Naprosyn and Aspirin based products such as Excedrin, Goody's Powder, BC Powder. You may take Tylenol if needed for pain up until the day of surgery.  Stop ANY OVER THE COUNTER supplements until after surgery.  ON THE DAY OF SURGERY ONLY TAKE THESE MEDICATIONS WITH SIPS OF WATER:  montelukast (SINGULAIR)  pantoprazole (PROTONIX)   Use inhalers on the day of surgery and bring to the hospital.   No Alcohol for 24 hours before or after  surgery.  No Smoking including e-cigarettes for 24 hours before surgery.  No chewable tobacco products for at least 6 hours before surgery.  No nicotine patches on the day of surgery.  Do not use any "recreational" drugs for at least a week (preferably 2 weeks) before your surgery.  Please be advised that the combination of cocaine and anesthesia may have negative outcomes, up to and including death. If you test positive for cocaine, your surgery will be cancelled.  On the morning of surgery brush your teeth with toothpaste and water, you may rinse your mouth with mouthwash if you wish. Do not swallow any toothpaste or mouthwash.  Use CHG Soap or wipes as directed on instruction sheet.  Do not wear jewelry, make-up, hairpins, clips or nail polish.  For welded (permanent) jewelry: bracelets, anklets, waist bands, etc.  Please have this removed prior to surgery.  If it is not removed, there is a chance that hospital personnel will need to cut it off on the day of surgery.  Do not wear lotions, powders, or perfumes.   Do not shave body hair from the neck down 48 hours before surgery.  Contact lenses, hearing aids and dentures may not be worn into surgery.  Do not bring valuables to the hospital. [facility] is not responsible for any missing/lost belongings or valuables.   Notify your doctor if there is any change in your medical condition (cold, fever, infection).  Wear comfortable clothing (specific to your surgery type) to the hospital.  After surgery, you  can help prevent lung complications by doing breathing exercises.  Take deep breaths and cough every 1-2 hours. Your doctor may order a device called an Incentive Spirometer to help you take deep breaths. When coughing or sneezing, hold a pillow firmly against your incision with both hands. This is called "splinting." Doing this helps protect your incision. It also decreases belly discomfort.  If you are being admitted to the  hospital overnight, leave your suitcase in the car. After surgery it may be brought to your room.  In case of increased patient census, it may be necessary for you, the patient, to continue your postoperative care in the Same Day Surgery department.  If you are being discharged the day of surgery, you will not be allowed to drive home. You will need a responsible individual to drive you home and stay with you for 24 hours after surgery.   If you are taking public transportation, you will need to have a responsible individual with you.  Please call the Pre-admissions Testing Dept. at [phone] if you have any questions about these instructions.  Surgery Visitation Policy:  Patients having surgery or a procedure may have two visitors.  Children under the age of 35 must have an adult with them who is not the patient.  Inpatient Visitation:    Visiting hours are 7 a.m. to 8 p.m. Up to four visitors are allowed at one time in a patient room. The visitors may rotate out with other people during the day.  One visitor age 2 or older may stay with the patient overnight and must be in the room by 8 p.m.    Pre-operative 5 CHG Bath Instructions   You can play a key role in reducing the risk of infection after surgery. Your skin needs to be as free of germs as possible. You can reduce the number of germs on your skin by washing with CHG (chlorhexidine gluconate) soap before surgery. CHG is an antiseptic soap that kills germs and continues to kill germs even after washing.   DO NOT use if you have an allergy to chlorhexidine/CHG or antibacterial soaps. If your skin becomes reddened or irritated, stop using the CHG and notify one of our RNs at [phone].   Please shower with the CHG soap starting 4 days before surgery using the following schedule: 04/06 - 04/10.           Please keep in mind the following:  DO NOT shave, including legs and underarms, starting the day of your first shower.    You may shave your face at any point before/day of surgery.  Place clean sheets on your bed the day you start using CHG soap. Use a clean washcloth (not used since being washed) for each shower. DO NOT sleep with pets once you start using the CHG.   CHG Shower Instructions:  If you choose to wash your hair and private area, wash first with your normal shampoo/soap.  After you use shampoo/soap, rinse your hair and body thoroughly to remove shampoo/soap residue.  Turn the water OFF and apply about 3 tablespoons (45 ml) of CHG soap to a CLEAN washcloth.  Apply CHG soap ONLY FROM YOUR NECK DOWN TO YOUR TOES (washing for 3-5 minutes)  DO NOT use CHG soap on face, private areas, open wounds, or sores.  Pay special attention to the area where your surgery is being performed.  If you are having back surgery, having someone wash your back for you  may be helpful. Wait 2 minutes after CHG soap is applied, then you may rinse off the CHG soap.  Pat dry with a clean towel  Put on clean clothes/pajamas   If you choose to wear lotion, please use ONLY the CHG-compatible lotions on the back of this paper.     Additional instructions for the day of surgery: DO NOT APPLY any lotions, deodorants, cologne, or perfumes.   Put on clean/comfortable clothes.  Brush your teeth.  Ask your nurse before applying any prescription medications to the skin.      CHG Compatible Lotions   Aveeno Moisturizing lotion  Cetaphil Moisturizing Cream  Cetaphil Moisturizing Lotion  Clairol Herbal Essence Moisturizing Lotion, Dry Skin  Clairol Herbal Essence Moisturizing Lotion, Extra Dry Skin  Clairol Herbal Essence Moisturizing Lotion, Normal Skin  Curel Age Defying Therapeutic Moisturizing Lotion with Alpha Hydroxy  Curel Extreme Care Body Lotion  Curel Soothing Hands Moisturizing Hand Lotion  Curel Therapeutic Moisturizing Cream, Fragrance-Free  Curel Therapeutic Moisturizing Lotion, Fragrance-Free  Curel  Therapeutic Moisturizing Lotion, Original Formula  Eucerin Daily Replenishing Lotion  Eucerin Dry Skin Therapy Plus Alpha Hydroxy Crme  Eucerin Dry Skin Therapy Plus Alpha Hydroxy Lotion  Eucerin Original Crme  Eucerin Original Lotion  Eucerin Plus Crme Eucerin Plus Lotion  Eucerin TriLipid Replenishing Lotion  Keri Anti-Bacterial Hand Lotion  Keri Deep Conditioning Original Lotion Dry Skin Formula Softly Scented  Keri Deep Conditioning Original Lotion, Fragrance Free Sensitive Skin Formula  Keri Lotion Fast Absorbing Fragrance Free Sensitive Skin Formula  Keri Lotion Fast Absorbing Softly Scented Dry Skin Formula  Keri Original Lotion  Keri Skin Renewal Lotion Keri Silky Smooth Lotion  Keri Silky Smooth Sensitive Skin Lotion  Nivea Body Creamy Conditioning Oil  Nivea Body Extra Enriched Lotion  Nivea Body Original Lotion  Nivea Body Sheer Moisturizing Lotion Nivea Crme  Nivea Skin Firming Lotion  NutraDerm 30 Skin Lotion  NutraDerm Skin Lotion  NutraDerm Therapeutic Skin Cream  NutraDerm Therapeutic Skin Lotion  ProShield Protective Hand Cream  Provon moisturizing lotion  How to Use an Incentive Spirometer  An incentive spirometer is a tool that measures how well you are filling your lungs with each breath. Learning to take long, deep breaths using this tool can help you keep your lungs clear and active. This may help to reverse or lessen your chance of developing breathing (pulmonary) problems, especially infection. You may be asked to use a spirometer: After a surgery. If you have a lung problem or a history of smoking. After a long period of time when you have been unable to move or be active. If the spirometer includes an indicator to show the highest number that you have reached, your health care provider or respiratory therapist will help you set a goal. Keep a log of your progress as told by your health care provider. What are the risks? Breathing too quickly may cause  dizziness or cause you to pass out. Take your time so you do not get dizzy or light-headed. If you are in pain, you may need to take pain medicine before doing incentive spirometry. It is harder to take a deep breath if you are having pain. How to use your incentive spirometer  Sit up on the edge of your bed or on a chair. Hold the incentive spirometer so that it is in an upright position. Before you use the spirometer, breathe out normally. Place the mouthpiece in your mouth. Make sure your lips are closed tightly around it.  Breathe in slowly and as deeply as you can through your mouth, causing the piston or the ball to rise toward the top of the chamber. Hold your breath for 3-5 seconds, or for as long as possible. If the spirometer includes a coach indicator, use this to guide you in breathing. Slow down your breathing if the indicator goes above the marked areas. Remove the mouthpiece from your mouth and breathe out normally. The piston or ball will return to the bottom of the chamber. Rest for a few seconds, then repeat the steps 10 or more times. Take your time and take a few normal breaths between deep breaths so that you do not get dizzy or light-headed. Do this every 1-2 hours when you are awake. If the spirometer includes a goal marker to show the highest number you have reached (best effort), use this as a goal to work toward during each repetition. After each set of 10 deep breaths, cough a few times. This will help to make sure that your lungs are clear. If you have an incision on your chest or abdomen from surgery, place a pillow or a rolled-up towel firmly against the incision when you cough. This can help to reduce pain while taking deep breaths and coughing. General tips When you are able to get out of bed: Walk around often. Continue to take deep breaths and cough in order to clear your lungs. Keep using the incentive spirometer until your health care provider says it is okay  to stop using it. If you have been in the hospital, you may be told to keep using the spirometer at home. Contact a health care provider if: You are having difficulty using the spirometer. You have trouble using the spirometer as often as instructed. Your pain medicine is not giving enough relief for you to use the spirometer as told. You have a fever. Get help right away if: You develop shortness of breath. You develop a cough with bloody mucus from the lungs. You have fluid or blood coming from an incision site after you cough. Summary An incentive spirometer is a tool that can help you learn to take long, deep breaths to keep your lungs clear and active. You may be asked to use a spirometer after a surgery, if you have a lung problem or a history of smoking, or if you have been inactive for a long period of time. Use your incentive spirometer as instructed every 1-2 hours while you are awake. If you have an incision on your chest or abdomen, place a pillow or a rolled-up towel firmly against your incision when you cough. This will help to reduce pain. Get help right away if you have shortness of breath, you cough up bloody mucus, or blood comes from your incision when you cough. This information is not intended to replace advice given to you by your health care provider. Make sure you discuss any questions you have with your health care provider. Document Revised: 10/07/2019 Document Reviewed: 10/07/2019 Elsevier Patient Education  2023 [employer]   Class available on 11/02/23:

## 2023-10-27 NOTE — Pre-Procedure Instructions (Signed)
 PAT appt scheduled today as in person, patient did not arrive. Patient called and he voiced that he has cancelled his surgery and made Dr.Abermans office aware x 2. This Clinical research associate called dr. Margretta Sidle office and they voiced that they are aware of the cancellation.

## 2023-11-06 ENCOUNTER — Other Ambulatory Visit

## 2023-11-09 ENCOUNTER — Ambulatory Visit: Admit: 2023-11-09 | Admitting: Orthopedic Surgery

## 2023-11-09 SURGERY — ARTHROPLASTY, HIP, TOTAL, ANTERIOR APPROACH
Anesthesia: Choice | Site: Hip | Laterality: Right

## 2023-11-22 ENCOUNTER — Encounter: Payer: Self-pay | Admitting: Dermatology

## 2023-11-22 ENCOUNTER — Ambulatory Visit: Admitting: Dermatology

## 2023-11-22 DIAGNOSIS — L720 Epidermal cyst: Secondary | ICD-10-CM | POA: Diagnosis not present

## 2023-11-22 MED ORDER — MUPIROCIN 2 % EX OINT: TOPICAL_OINTMENT | CUTANEOUS | 0 refills | Status: AC

## 2023-11-22 NOTE — Patient Instructions (Signed)
 Wound Care Instructions  On the day following your surgery, you should begin doing daily dressing changes: Remove the old dressing and discard it. Cleanse the wound gently with tap water. This may be done in the shower or by placing a wet gauze pad directly on the wound and letting it soak for several minutes. It is important to gently remove any dried blood from the wound in order to encourage healing. This may be done by gently rolling a moistened Q-tip on the dried blood. Do not pick at the wound. If the wound should start to bleed, continue cleaning the wound, then place a moist gauze pad on the wound and hold pressure for a few minutes.  Make sure you then dry the skin surrounding the wound completely or the tape will not stick to the skin. Do not use cotton balls on the wound. After the wound is clean and dry, apply the ointment gently with a Q-tip. Cut a non-stick pad to fit the size of the wound. Lay the pad flush to the wound. If the wound is draining, you may want to reinforce it with a small amount of gauze on top of the non-stick pad for a little added compression to the area. Use the tape to seal the area completely. Select from the following with respect to your individual situation: If your wound has been stitched closed: continue the above steps 1-8 at least daily until your sutures are removed. If your wound has been left open to heal: continue steps 1-8 at least daily for the first 3-4 weeks. We would like for you to take a few extra precautions for at least the next week. Sleep with your head elevated on pillows if our wound is on your head. Do not bend over or lift heavy items to reduce the chance of elevated blood pressure to the wound Do not participate in particularly strenuous activities.   Below is a list of dressing supplies you might need.  Cotton-tipped applicators - Q-tips Gauze pads (2x2 and/or 4x4) - All-Purpose Sponges Non-stick dressing material - Telfa Tape -  Paper or Hypafix New and clean tube of petroleum jelly - Vaseline    Comments on Post-Operative Period Slight swelling and redness often appear around the wound. This is normal and will disappear within several days following the surgery. The healing wound will drain a brownish-red-yellow discharge during healing. This is a normal phase of wound healing. As the wound begins to heal, the drainage may increase in amount. Again, this drainage is normal. Notify us if the drainage becomes persistently bloody, excessively swollen, or intensely painful or develops a foul odor or red streaks.  If you should experience mild discomfort during the healing phase, you may take an aspirin-free medication such as Tylenol (acetaminophen). Notify us if the discomfort is severe or persistent. Avoid alcoholic beverages when taking pain medicine.  In Case of Wound Hemorrhage A wound hemorrhage is when the bandage suddenly becomes soaked with bright red blood and flows profusely. If this happens, sit down or lie down with your head elevated. If the wound has a dressing on it, do not remove the dressing. Apply pressure to the existing gauze. If the wound is not covered, use a gauze pad to apply pressure and continue applying the pressure for 20 minutes without peeking. DO NOT COVER THE WOUND WITH A LARGE TOWEL OR WASH CLOTH. Release your hand from the wound site but do not remove the dressing. If the bleeding has stopped,  gently clean around the wound. Leave the dressing in place for 24 hours if possible. This wait time allows the blood vessels to close off so that you do not spark a new round of bleeding by disrupting the newly clotted blood vessels with an immediate dressing change. If the bleeding does not subside, continue to hold pressure. If matters are out of your control, contact an After Hours clinic or go to the Emergency Room.

## 2023-11-22 NOTE — Progress Notes (Unsigned)
   Follow-Up Visit   Subjective  Christopher Campos is a 53 y.o. male who presents for the following: Excision of a cyst on the left arm  The following portions of the chart were reviewed this encounter and updated as appropriate: medications, allergies, medical history  Review of Systems:  No other skin or systemic complaints except as noted in HPI or Assessment and Plan.  Objective  Well appearing patient in no apparent distress; mood and affect are within normal limits.  A focused examination was performed of the following areas: left arm   Relevant physical exam findings are noted in the Assessment and Plan.   left sup deltoid 1.5 cm Subcutaneous nodule at L sup deltoid   Assessment & Plan   EIC (EPIDERMAL INCLUSION CYST) left sup deltoid Start Mupirocin  ointment apply qd-bid  Skin excision - left sup deltoid  Total excision diameter (cm):  2 Informed consent: discussed and consent obtained   Timeout: patient name, date of birth, surgical site, and procedure verified   Procedure prep:  Patient was prepped and draped in usual sterile fashion Prep type:  Chlorhexidine Anesthesia: the lesion was anesthetized in a standard fashion   Anesthetic:  1% lidocaine  w/ epinephrine  1-100,000 buffered w/ 8.4% NaHCO3 (15 cc) Instrument used: #15 blade   Hemostasis achieved with: pressure   Outcome: patient tolerated procedure well with no complications    Skin repair - left sup deltoid Complexity:  Intermediate Final length (cm):  4.1 Informed consent: discussed and consent obtained   Timeout: patient name, date of birth, surgical site, and procedure verified   Procedure prep:  Patient was prepped and draped in usual sterile fashion Prep type:  Chlorhexidine Anesthesia: the lesion was anesthetized in a standard fashion   Anesthetic:  1% lidocaine  w/ epinephrine  1-100,000 buffered w/ 8.4% NaHCO3 Reason for type of repair: reduce tension to allow closure, reduce the risk of dehiscence,  infection, and necrosis, reduce subcutaneous dead space and avoid a hematoma, allow closure of the large defect and preserve normal anatomy   Undermining: edges could be approximated without difficulty   Subcutaneous layers (deep stitches):  Suture size:  3-0 Suture type: Monocryl (poliglecaprone 25)   Stitches:  Buried vertical mattress Fine/surface layer approximation (top stitches):  Suture size:  4-0 Suture type: Prolene (polypropylene)   Stitches: simple running   Hemostasis achieved with: suture, pressure and electrodesiccation Outcome: patient tolerated procedure well with no complications   Post-procedure details: sterile dressing applied and wound care instructions given   Dressing type: petrolatum, bandage and pressure dressing   Specimen 1 - Surgical pathology Differential Diagnosis: R/O Epidermal cyst   Check Margins: No Running locked on top  Return in about 2 weeks (around 12/06/2023) for suture removal .  I, Clara Crisp, CMA, am acting as scribe for Harris Liming, MD .   Documentation: I have reviewed the above documentation for accuracy and completeness, and I agree with the above.  Harris Liming, MD

## 2023-11-27 LAB — SURGICAL PATHOLOGY

## 2023-11-29 ENCOUNTER — Telehealth: Payer: Self-pay

## 2023-11-29 NOTE — Telephone Encounter (Signed)
-----   Message from Harrison sent at 11/27/2023  9:19 PM EDT ----- Diagnosis left sup deltoid :       EXCISION, EPIDERMOID CYST, MARGINS FREE    Please call to share that excision fully removed benign cyst and get update on surgical wound. Thank you.

## 2023-11-29 NOTE — Telephone Encounter (Signed)
 Discussed pathology results. Surgical site is healing well, no issues.

## 2023-12-07 ENCOUNTER — Encounter: Payer: Self-pay | Admitting: Dermatology

## 2023-12-07 ENCOUNTER — Ambulatory Visit: Admitting: Dermatology

## 2023-12-07 DIAGNOSIS — B360 Pityriasis versicolor: Secondary | ICD-10-CM

## 2023-12-07 DIAGNOSIS — Z5189 Encounter for other specified aftercare: Secondary | ICD-10-CM

## 2023-12-07 DIAGNOSIS — Z4802 Encounter for removal of sutures: Secondary | ICD-10-CM

## 2023-12-07 MED ORDER — KETOCONAZOLE 2 % EX SHAM: 1.0000 | MEDICATED_SHAMPOO | CUTANEOUS | 11 refills | Status: AC

## 2023-12-07 NOTE — Patient Instructions (Addendum)
Tinea versicolor is a chronic recurrent skin rash causing discolored scaly spots most commonly seen on back, chest, and/or shoulders.  It is generally asymptomatic. The rash is due to overgrowth of a common type of yeast present on everyone's skin and it is not contagious.  It tends to flare more in the summer due to increased sweating on trunk.  After rash is treated, the scaliness will resolve, but the discoloration will take longer to return to normal pigmentation. The periodic use of an OTC medicated soap/shampoo with zinc or selenium sulfide can be helpful to prevent yeast overgrowth and recurrence.    Due to recent changes in healthcare laws, you may see results of your pathology and/or laboratory studies on MyChart before the doctors have had a chance to review them. We understand that in some cases there may be results that are confusing or concerning to you. Please understand that not all results are received at the same time and often the doctors may need to interpret multiple results in order to provide you with the best plan of care or course of treatment. Therefore, we ask that you please give [place] 2 business days to thoroughly review all your results before contacting the office for clarification. Should we see a critical lab result, you will be contacted sooner.   If You Need Anything After Your Visit  If you have any questions or concerns for your doctor, please call our main line at [phone] and press option 4 to reach your doctor's medical assistant. If no one answers, please leave a voicemail as directed and we will return your call as soon as possible. Messages left after 4 pm will be answered the following business day.   You may also send [place] a message via MyChart. We typically respond to MyChart messages within 1-2 business days.  For prescription refills, please ask your pharmacy to contact our office. Our fax number is [phone].  If you have an urgent issue when the  clinic is closed that cannot wait until the next business day, you can page your doctor at the number below.    Please note that while we do our best to be available for urgent issues outside of office hours, we are not available 24/7.   If you have an urgent issue and are unable to reach [place], you may choose to seek medical care at your doctor's office, retail clinic, urgent care center, or emergency room.  If you have a medical emergency, please immediately call 911 or go to the emergency department.  Pager Numbers  - Dr. Gwen Pounds: [phone]  - Dr. Roseanne Reno: [phone]  - Dr. Katrinka Blazing: [phone]   In the event of inclement weather, please call our main line at [phone] for an update on the status of any delays or closures.  Dermatology Medication Tips: Please keep the boxes that topical medications come in in order to help keep track of the instructions about where and how to use these. Pharmacies typically print the medication instructions only on the boxes and not directly on the medication tubes.   If your medication is too expensive, please contact our office at [phone] option 4 or send [place] a message through MyChart.   We are unable to tell what your co-pay for medications will be in advance as this is different depending on your insurance coverage. However, we may be able to find a substitute medication at lower cost or fill out paperwork to get insurance to cover a  needed medication.   If a prior authorization is required to get your medication covered by your insurance company, please allow [place] 1-2 business days to complete this process.  Drug prices often vary depending on where the prescription is filled and some pharmacies may offer cheaper prices.  The website www.goodrx.com contains coupons for medications through different pharmacies. The prices here do not account for what the cost may be with help from insurance (it may be cheaper with your insurance), but the  website can give you the price if you did not use any insurance.  - You can print the associated coupon and take it with your prescription to the pharmacy.  - You may also stop by our office during regular business hours and pick up a GoodRx coupon card.  - If you need your prescription sent electronically to a different pharmacy, notify our office through [facility] or by phone at [phone] option 4.     Si Usted Necesita Algo Despus de Su Visita  Tambin puede enviarnos un mensaje a travs de Clinical cytogeneticist. Por lo general respondemos a los mensajes de MyChart en el transcurso de 1 a 2 das hbiles.  Para renovar recetas, por favor pida a su farmacia que se ponga en contacto con nuestra oficina. Annie Sable de fax es [place] [phone].  Si tiene un asunto urgente cuando la clnica est cerrada y que no puede esperar hasta el siguiente da hbil, puede llamar/localizar a su doctor(a) al nmero que aparece a continuacin.   Por favor, tenga en cuenta que aunque hacemos todo lo posible para estar disponibles para asuntos urgentes fuera del horario de [place], no estamos disponibles las 24 horas del da, los 7 [address]  [address] de la [place].   Si tiene un problema urgente y no puede comunicarse con nosotros, puede optar por buscar atencin mdica  en el consultorio de su doctor(a), en una clnica privada, en un centro de atencin urgente o en una sala de emergencias.  Si tiene Engineer, drilling, por favor llame inmediatamente al 911 o vaya a la sala de emergencias.  Nmeros de bper  - Dr. Gwen Pounds: [phone]  - Dra. Roseanne Reno: [phone]  - Dr. Katrinka Blazing: [phone]   En caso de inclemencias del tiempo, por favor llame a Lacy Duverney principal al [phone] para una actualizacin sobre el [place] de cualquier retraso o cierre.  Consejos para la medicacin en dermatologa: Por favor, guarde las cajas en las que vienen los medicamentos de uso tpico para ayudarle a seguir las  instrucciones sobre dnde y cmo usarlos. Las farmacias generalmente imprimen las instrucciones del medicamento slo en las cajas y no directamente en los tubos del [place].   Si su medicamento es muy caro, por favor, pngase en contacto con Rolm Gala llamando al [phone] y presione la opcin 4 o envenos un mensaje a travs de Clinical cytogeneticist.   No podemos decirle cul ser su copago por los medicamentos por adelantado ya que esto es diferente dependiendo de la cobertura de su seguro. Sin embargo, es posible que podamos encontrar un medicamento sustituto a Audiological scientist un formulario para que el seguro cubra el medicamento que se considera necesario.   Si se requiere una autorizacin previa para que su compaa de seguros [place] su medicamento, por favor permtanos de 1 a 2 das hbiles para completar [address]  Los precios de los medicamentos varan con frecuencia dependiendo del Environmental consultant de dnde se surte la receta y alguna farmacias pueden ofrecer precios ms baratos.  El sitio web www.goodrx.com tiene cupones para medicamentos de Health and safety inspector. Los precios aqu no tienen en cuenta lo que podra costar con la ayuda del seguro (puede ser ms barato con su seguro), pero el sitio web puede darle el precio si no utiliz Tourist information centre manager.  - Puede imprimir el cupn correspondiente y llevarlo con su receta a la farmacia.  - Tambin puede pasar por nuestra oficina durante el horario de atencin regular y Education officer, museum una tarjeta de cupones de GoodRx.  - Si necesita que su receta se enve electrnicamente a una farmacia diferente, informe a nuestra oficina a travs de MyChart de [place] o por telfono llamando al [phone] y presione la opcin 4.

## 2023-12-07 NOTE — Progress Notes (Signed)
   Follow-Up Visit   Subjective  Christopher Campos is a 53 y.o. male who presents for the following: Epidermoid cyst bx proven, L sup deltoid, pt presents for suture removal, spots back, no symptoms, pt does not know how long they have been there, wife noticed The patient has spots, moles and lesions to be evaluated, some may be new or changing and the patient may have concern these could be cancer.   The following portions of the chart were reviewed this encounter and updated as appropriate: medications, allergies, medical history  Review of Systems:  No other skin or systemic complaints except as noted in HPI or Assessment and Plan.  Objective  Well appearing patient in no apparent distress; mood and affect are within normal limits.   A focused examination was performed of the following areas: Left arm, back  Relevant exam findings are noted in the Assessment and Plan.    Assessment & Plan   EPIDERMOID CYST Bx proven L sup deltoid Exam: healing excision site  Treatment Plan: Encounter for Removal of Sutures - Incision site at the left sup deltoid is clean, dry and intact - Wound cleansed, sutures removed, wound cleansed and steri strips applied.  - Discussed pathology results showing Epidermoid cyst  - Patient advised to keep steri-strips dry until they fall off. - Scars remodel for a full year. - Once steri-strips fall off, patient can apply over-the-counter silicone scar cream each night to help with scar remodeling if desired. - Patient advised to call with any concerns or if they notice any new or changing lesions.  Tinea Versicolor, flaring back Exam: coalescing hypopigmented macules back, arms   Tinea versicolor is a chronic recurrent skin rash causing discolored scaly spots most commonly seen on back, chest, and/or shoulders.  It is generally asymptomatic. The rash is due to overgrowth of a common type of yeast present on everyone's skin and it is not contagious.   It tends to flare more in the summer due to increased sweating on trunk.  After rash is treated, the scaliness will resolve, but the discoloration will take longer to return to normal pigmentation. The periodic use of an OTC medicated soap/shampoo with zinc  or selenium sulfide can be helpful to prevent yeast overgrowth and recurrence.   Treatment: Start Ketoconazole shampoo as body wash twice a week, let sit 5 minutes and rinse off VISIT FOR WOUND CHECK   ENCOUNTER FOR REMOVAL OF SUTURES   TINEA VERSICOLOR    Return if symptoms worsen or fail to improve.  I, Rollie Clipper, RMA, am acting as scribe for Harris Liming, MD .   Documentation: I have reviewed the above documentation for accuracy and completeness, and I agree with the above.  Harris Liming, MD
